# Patient Record
Sex: Male | Born: 1955
Health system: Southern US, Community
[De-identification: ages and names within clinical notes are randomized; demographics above are authoritative.]

## PROBLEM LIST (undated history)

## (undated) DIAGNOSIS — E785 Hyperlipidemia, unspecified: Secondary | ICD-10-CM

## (undated) DIAGNOSIS — I10 Essential (primary) hypertension: Secondary | ICD-10-CM

## (undated) DIAGNOSIS — K219 Gastro-esophageal reflux disease without esophagitis: Secondary | ICD-10-CM

## (undated) HISTORY — DX: Hyperlipidemia, unspecified: E78.5

## (undated) HISTORY — DX: Essential (primary) hypertension: I10

## (undated) HISTORY — DX: Gastro-esophageal reflux disease without esophagitis: K21.9

---

## 2011-10-07 ENCOUNTER — Other Ambulatory Visit (INDEPENDENT_AMBULATORY_CARE_PROVIDER_SITE_OTHER): Payer: Commercial Managed Care - PPO

## 2011-10-07 ENCOUNTER — Encounter: Payer: Self-pay | Admitting: Internal Medicine

## 2011-10-07 ENCOUNTER — Ambulatory Visit (INDEPENDENT_AMBULATORY_CARE_PROVIDER_SITE_OTHER): Payer: Commercial Managed Care - PPO | Admitting: Internal Medicine

## 2011-10-07 VITALS — BP 122/80 | HR 80 | Temp 97.4°F | Resp 16 | Ht 63.0 in | Wt 206.8 lb

## 2011-10-07 DIAGNOSIS — Z136 Encounter for screening for cardiovascular disorders: Secondary | ICD-10-CM

## 2011-10-07 DIAGNOSIS — Z23 Encounter for immunization: Secondary | ICD-10-CM

## 2011-10-07 DIAGNOSIS — Z0000 Encounter for general adult medical examination without abnormal findings: Secondary | ICD-10-CM

## 2011-10-07 DIAGNOSIS — I10 Essential (primary) hypertension: Secondary | ICD-10-CM | POA: Insufficient documentation

## 2011-10-07 DIAGNOSIS — E669 Obesity, unspecified: Secondary | ICD-10-CM | POA: Insufficient documentation

## 2011-10-07 LAB — LIPID PANEL
Cholesterol: 238 mg/dL — ABNORMAL HIGH (ref 0–200)
Triglycerides: 206 mg/dL — ABNORMAL HIGH (ref 0.0–149.0)

## 2011-10-07 LAB — CBC WITH DIFFERENTIAL/PLATELET
Basophils Absolute: 0.1 10*3/uL (ref 0.0–0.1)
HCT: 47.3 % (ref 39.0–52.0)
Hemoglobin: 15.7 g/dL (ref 13.0–17.0)
Lymphs Abs: 2 10*3/uL (ref 0.7–4.0)
MCV: 91.3 fl (ref 78.0–100.0)
Monocytes Absolute: 0.8 10*3/uL (ref 0.1–1.0)
Neutro Abs: 7.1 10*3/uL (ref 1.4–7.7)
Platelets: 239 10*3/uL (ref 150.0–400.0)
RDW: 13.2 % (ref 11.5–14.6)

## 2011-10-07 LAB — COMPREHENSIVE METABOLIC PANEL
ALT: 30 U/L (ref 0–53)
Alkaline Phosphatase: 83 U/L (ref 39–117)
CO2: 26 mEq/L (ref 19–32)
Creatinine, Ser: 0.9 mg/dL (ref 0.4–1.5)
GFR: 92.71 mL/min (ref >60.00)
Glucose, Bld: 87 mg/dL (ref 70–99)
Sodium: 139 mEq/L (ref 135–145)
Total Bilirubin: 0.5 mg/dL (ref 0.3–1.2)
Total Protein: 6.6 g/dL (ref 6.0–8.3)

## 2011-10-07 LAB — URINALYSIS, ROUTINE W REFLEX MICROSCOPIC
Bilirubin Urine: NEGATIVE
Leukocytes, UA: NEGATIVE
Nitrite: NEGATIVE
Specific Gravity, Urine: 1.02 (ref 1.000–1.030)
Total Protein, Urine: NEGATIVE
pH: 6 (ref 5.0–8.0)

## 2011-10-07 LAB — HEMOGLOBIN A1C: Hgb A1c MFr Bld: 5.7 % (ref 4.6–6.5)

## 2011-10-07 MED ORDER — LISINOPRIL-HYDROCHLOROTHIAZIDE 10-12.5 MG PO TABS: 1.0000 | ORAL_TABLET | Freq: Every day | ORAL | Status: DC

## 2011-10-07 NOTE — Progress Notes (Signed)
  Subjective:    Patient ID: Blake Anderson, male    DOB: 29-Nov-1955, 56 y.o.   MRN: [id number]  HPI New to me for a physical.   Review of Systems  Constitutional: Positive for unexpected weight change (weight gain). Negative for fever, chills, diaphoresis, activity change, appetite change and fatigue.  HENT: Negative.   Eyes: Negative.   Respiratory: Negative.  Negative for shortness of breath, wheezing and stridor.   Cardiovascular: Negative.  Negative for chest pain, palpitations and leg swelling.  Gastrointestinal: Negative.   Genitourinary: Negative.   Musculoskeletal: Negative.   Skin: Negative.   Neurological: Negative.   Hematological: Negative for adenopathy. Does not bruise/bleed easily.  Psychiatric/Behavioral: Negative.        Objective:   Physical Exam  Vitals reviewed. Constitutional: He is oriented to person, place, and time. He appears well-developed and well-nourished. No distress.  HENT:  Head: Normocephalic and atraumatic.  Mouth/Throat: Oropharynx is clear and moist. No oropharyngeal exudate.  Eyes: Conjunctivae are normal. Right eye exhibits no discharge. Left eye exhibits no discharge. No scleral icterus.  Neck: Normal range of motion. Neck supple. No JVD present. No tracheal deviation present. No thyromegaly present.  Cardiovascular: Normal rate, regular rhythm, normal heart sounds and intact distal pulses.  Exam reveals no gallop and no friction rub.   No murmur heard. Pulmonary/Chest: Effort normal and breath sounds normal. No stridor. No respiratory distress. He has no wheezes. He has no rales. He exhibits no tenderness.  Abdominal: Soft. Bowel sounds are normal. He exhibits no distension and no mass. There is no tenderness. There is no rebound and no guarding. Hernia confirmed negative in the right inguinal area and confirmed negative in the left inguinal area.  Genitourinary: Prostate normal, testes normal and penis normal. Rectal exam shows external  hemorrhoid. Rectal exam shows no internal hemorrhoid, no fissure, no mass, no tenderness and anal tone normal. Guaiac negative stool. Prostate is not enlarged and not tender. Right testis shows no mass, no swelling and no tenderness. Right testis is descended. Left testis shows no mass, no swelling and no tenderness. Left testis is descended. Circumcised. No penile tenderness. No discharge found.  Musculoskeletal: Normal range of motion. He exhibits no edema and no tenderness.  Lymphadenopathy:    He has no cervical adenopathy.       Right: No inguinal adenopathy present.       Left: No inguinal adenopathy present.  Neurological: He is oriented to person, place, and time.  Skin: Skin is warm and dry. No rash noted. He is not diaphoretic. No erythema. No pallor.  Psychiatric: He has a normal mood and affect. His behavior is normal. Judgment and thought content normal.          Assessment & Plan:

## 2011-10-07 NOTE — Patient Instructions (Addendum)
Health Maintenance, Males A healthy lifestyle and preventative care can promote health and wellness.  Maintain regular health, dental, and eye exams.   Eat a healthy diet. Foods like vegetables, fruits, whole grains, low-fat dairy products, and lean protein foods contain the nutrients you need without too many calories. Decrease your intake of foods high in solid fats, added sugars, and salt. Get information about a proper diet from your caregiver, if necessary.   Regular physical exercise is one of the most important things you can do for your health. Most adults should get at least 150 minutes of moderate-intensity exercise (any activity that increases your heart rate and causes you to sweat) each week. In addition, most adults need muscle-strengthening exercises on 2 or more days a week.    Maintain a healthy weight. The body mass index (BMI) is a screening tool to identify possible weight problems. It provides an estimate of body fat based on height and weight. Your caregiver can help determine your BMI, and can help you achieve or maintain a healthy weight. For adults 20 years and older:   A BMI below 18.5 is considered underweight.   A BMI of 18.5 to 24.9 is normal.   A BMI of 25 to 29.9 is considered overweight.   A BMI of 30 and above is considered obese.   Maintain normal blood lipids and cholesterol by exercising and minimizing your intake of saturated fat. Eat a balanced diet with plenty of fruits and vegetables. Blood tests for lipids and cholesterol should begin at age 20 and be repeated every 5 years. If your lipid or cholesterol levels are high, you are over 50, or you are a high risk for heart disease, you may need your cholesterol levels checked more frequently.Ongoing high lipid and cholesterol levels should be treated with medicines, if diet and exercise are not effective.   If you smoke, find out from your caregiver how to quit. If you do not use tobacco, do not start.    If you choose to drink alcohol, do not exceed 2 drinks per day. One drink is considered to be 12 ounces (355 mL) of beer, 5 ounces (148 mL) of wine, or 1.5 ounces (44 mL) of liquor.   Avoid use of street drugs. Do not share needles with anyone. Ask for help if you need support or instructions about stopping the use of drugs.   High blood pressure causes heart disease and increases the risk of stroke. Blood pressure should be checked at least every 1 to 2 years. Ongoing high blood pressure should be treated with medicines if weight loss and exercise are not effective.   If you are 45 to 57 years old, ask your caregiver if you should take aspirin to prevent heart disease.   Diabetes screening involves taking a blood sample to check your fasting blood sugar level. This should be done once every 3 years, after age 45, if you are within normal weight and without risk factors for diabetes. Testing should be considered at a younger age or be carried out more frequently if you are overweight and have at least 1 risk factor for diabetes.   Colorectal cancer can be detected and often prevented. Most routine colorectal cancer screening begins at the age of 50 and continues through age 75. However, your caregiver may recommend screening at an earlier age if you have risk factors for colon cancer. On a yearly basis, your caregiver may provide home test kits to check for hidden   blood in the stool. Use of a small camera at the end of a tube, to directly examine the colon (sigmoidoscopy or colonoscopy), can detect the earliest forms of colorectal cancer. Talk to your caregiver about this at age 50, when routine screening begins. Direct examination of the colon should be repeated every 5 to 10 years through age 75, unless early forms of pre-cancerous polyps or small growths are found.   Healthy men should no longer receive prostate-specific antigen (PSA) blood tests as part of routine cancer screening. Consult with  your caregiver about prostate cancer screening.   Practice safe sex. Use condoms and avoid high-risk sexual practices to reduce the spread of sexually transmitted infections (STIs).   Use sunscreen with a sun protection factor (SPF) of 30 or greater. Apply sunscreen liberally and repeatedly throughout the day. You should seek shade when your shadow is shorter than you. Protect yourself by wearing long sleeves, pants, a wide-brimmed hat, and sunglasses year round, whenever you are outdoors.   Notify your caregiver of new moles or changes in moles, especially if there is a change in shape or color. Also notify your caregiver if a mole is larger than the size of a pencil eraser.   A one-time screening for abdominal aortic aneurysm (AAA) and surgical repair of large AAAs by sound wave imaging (ultrasonography) is recommended for ages 65 to 75 years who are current or former smokers.   Stay current with your immunizations.  Document Released: 01/25/2008 Document Revised: 04/10/2011 Document Reviewed: 12/24/2010 [employer] Patient Information 2012 [employer].Hypertension As your heart beats, it forces blood through your arteries. This force is your blood pressure. If the pressure is too high, it is called hypertension (HTN) or high blood pressure. HTN is dangerous because you may have it and not know it. High blood pressure may mean that your heart has to work harder to pump blood. Your arteries may be narrow or stiff. The extra work puts you at risk for heart disease, stroke, and other problems.  Blood pressure consists of two numbers, a higher number over a lower, 110/72, for example. It is stated as "110 over 72." The ideal is below 120 for the top number (systolic) and under 80 for the bottom (diastolic). Write down your blood pressure today. You should pay close attention to your blood pressure if you have certain conditions such as:  Heart failure.   Prior heart attack.   Diabetes   Chronic  kidney disease.   Prior stroke.   Multiple risk factors for heart disease.  To see if you have HTN, your blood pressure should be measured while you are seated with your arm held at the level of the heart. It should be measured at least twice. A one-time elevated blood pressure reading (especially in the Emergency Department) does not mean that you need treatment. There may be conditions in which the blood pressure is different between your right and left arms. It is important to see your caregiver soon for a recheck. Most people have essential hypertension which means that there is not a specific cause. This type of high blood pressure may be lowered by changing lifestyle factors such as:  Stress.   Smoking.   Lack of exercise.   Excessive weight.   Drug/tobacco/alcohol use.   Eating less salt.  Most people do not have symptoms from high blood pressure until it has caused damage to the body. Effective treatment can often prevent, delay or reduce that damage. TREATMENT    When a cause has been identified, treatment for high blood pressure is directed at the cause. There are a large number of medications to treat HTN. These fall into several categories, and your caregiver will help you select the medicines that are best for you. Medications may have side effects. You should review side effects with your caregiver. If your blood pressure stays high after you have made lifestyle changes or started on medicines,   Your medication(s) may need to be changed.   Other problems may need to be addressed.   Be certain you understand your prescriptions, and know how and when to take your medicine.   Be sure to follow up with your caregiver within the time frame advised (usually within two weeks) to have your blood pressure rechecked and to review your medications.   If you are taking more than one medicine to lower your blood pressure, make sure you know how and at what times they should be taken.  Taking two medicines at the same time can result in blood pressure that is too low.  SEEK IMMEDIATE MEDICAL CARE IF:  You develop a severe headache, blurred or changing vision, or confusion.   You have unusual weakness or numbness, or a faint feeling.   You have severe chest or abdominal pain, vomiting, or breathing problems.  MAKE SURE YOU:   Understand these instructions.   Will watch your condition.   Will get help right away if you are not doing well or get worse.  Document Released: 07/29/2005 Document Revised: 04/10/2011 Document Reviewed: 03/18/2008 [employer] Patient Information 2012 [employer]. 

## 2011-10-07 NOTE — Assessment & Plan Note (Signed)
His BP is well controlled, I will check his lytes today 

## 2011-10-07 NOTE — Assessment & Plan Note (Signed)
His EKG is normal 

## 2011-10-07 NOTE — Assessment & Plan Note (Signed)
Exam done, labs ordered, vaccines were updated. Pt ed material was given.

## 2011-11-02 ENCOUNTER — Ambulatory Visit (INDEPENDENT_AMBULATORY_CARE_PROVIDER_SITE_OTHER): Payer: Commercial Managed Care - PPO | Admitting: Family Medicine

## 2011-11-02 ENCOUNTER — Encounter: Payer: Self-pay | Admitting: Family Medicine

## 2011-11-02 VITALS — BP 110/70 | Temp 98.0°F | Wt 201.0 lb

## 2011-11-02 DIAGNOSIS — J209 Acute bronchitis, unspecified: Secondary | ICD-10-CM

## 2011-11-02 MED ORDER — AZITHROMYCIN 250 MG PO TABS: ORAL_TABLET | ORAL | Status: DC

## 2011-11-02 NOTE — Progress Notes (Signed)
OFFICE NOTE  11/02/2011  CC:  Chief Complaint  Patient presents with  . Nasal Congestion     HPI: Patient is a 56 y.o. Caucasian male who is here for cold sx's. Pt presents complaining of respiratory symptoms for 5-6 days.  Mostly nasal congestion/runny nose, sneezing, and PND cough.  Worst symptoms seems to be the fatigue.  Lately the symptoms seem to be stable. No fevers, no wheezing, and no SOB.  No pain in face or teeth.  No significant HA.  ST mild at most.  Symptoms made worse by nothing.  Symptoms improved by OTC cough med helped some. Smoker? no Recent sick contact? yes Muscle or joint aches? Mild soreness Flu shot this season at least 2 wks ago? no  ROS: no n/v/d or abdominal pain.  No rash.  No neck stiffness.   +Mild fatigue.  +Mild appetite loss.   Pertinent PMH:  RAD HTN Obesity  MEDS:  Outpatient Prescriptions Prior to Visit  Medication Sig Dispense Refill  . lisinopril-hydrochlorothiazide (PRINZIDE,ZESTORETIC) 10-12.5 MG per tablet Take 1 tablet by mouth daily.  90 tablet  1    PE: Blood pressure 110/70, temperature 98 F (36.7 C), temperature source Oral, weight 201 lb (91.173 kg). VS: noted--normal. Gen: alert, NAD, NONTOXIC APPEARING. HEENT: eyes without injection, drainage, or swelling.  Ears: EACs clear, TMs with normal light reflex and landmarks.  Nose: Clear rhinorrhea, with some dried, crusty exudate adherent to mildly injected mucosa.  No purulent d/c.  No paranasal sinus TTP.  No facial swelling.  Throat and mouth without focal lesion.  No pharyngial swelling, erythema, or exudate.   Neck: supple, no LAD.   LUNGS: CTA bilat, nonlabored resps.   CV: RRR, no m/r/g. EXT: no c/c/e SKIN: no rash    IMPRESSION AND PLAN:  Prolonged URI/cough. Empiric azithromycinx 5d. OTC cough med (without decongestant).  FOLLOW UP: prn

## 2011-11-02 NOTE — Patient Instructions (Signed)
Avoid decongestants. Try mucinex DM or robitussin DM.

## 2012-03-19 ENCOUNTER — Other Ambulatory Visit (INDEPENDENT_AMBULATORY_CARE_PROVIDER_SITE_OTHER): Payer: Commercial Managed Care - PPO

## 2012-03-19 ENCOUNTER — Encounter: Payer: Self-pay | Admitting: Internal Medicine

## 2012-03-19 ENCOUNTER — Ambulatory Visit (INDEPENDENT_AMBULATORY_CARE_PROVIDER_SITE_OTHER): Payer: Commercial Managed Care - PPO | Admitting: Internal Medicine

## 2012-03-19 VITALS — BP 120/80 | HR 74 | Temp 98.2°F | Resp 15 | Wt 204.0 lb

## 2012-03-19 DIAGNOSIS — E782 Mixed hyperlipidemia: Secondary | ICD-10-CM

## 2012-03-19 DIAGNOSIS — I10 Essential (primary) hypertension: Secondary | ICD-10-CM

## 2012-03-19 LAB — COMPREHENSIVE METABOLIC PANEL
AST: 23 U/L (ref 0–37)
Alkaline Phosphatase: 100 U/L (ref 39–117)
BUN: 10 mg/dL (ref 6–23)
Creatinine, Ser: 1 mg/dL (ref 0.4–1.5)
Potassium: 4.4 mEq/L (ref 3.5–5.1)

## 2012-03-19 LAB — LIPID PANEL
HDL: 42.4 mg/dL (ref >39.00)
Total CHOL/HDL Ratio: 5
Triglycerides: 264 mg/dL — ABNORMAL HIGH (ref 0.0–149.0)
VLDL: 52.8 mg/dL — ABNORMAL HIGH (ref 0.0–40.0)

## 2012-03-19 MED ORDER — LISINOPRIL-HYDROCHLOROTHIAZIDE 10-12.5 MG PO TABS: 1.0000 | ORAL_TABLET | Freq: Every day | ORAL | Status: DC

## 2012-03-19 NOTE — Assessment & Plan Note (Signed)
His BP is well controlled, I will check his lytes and renal function today 

## 2012-03-19 NOTE — Assessment & Plan Note (Signed)
He does not want to treat this, I talked to him about his risk factors for CAD and CVA, he wants to work on his lifestyle mods, he is not willing to take a statin, I will check his FLP today and if his trigs are near 500 then he will need to start a med for that

## 2012-03-19 NOTE — Progress Notes (Signed)
  Subjective:    Patient ID: Blake Anderson, male    DOB: 02-18-1956, 56 y.o.   MRN: [id number]  Hypertension This is a chronic problem. The current episode started more than 1 year ago. The problem has been gradually improving since onset. The problem is controlled. Pertinent negatives include no anxiety, blurred vision, chest pain, headaches, malaise/fatigue, neck pain, orthopnea, palpitations, peripheral edema, PND, shortness of breath or sweats. Past treatments include ACE inhibitors and diuretics. The current treatment provides significant improvement. Compliance problems include exercise and diet.       Review of Systems  Constitutional: Negative.  Negative for malaise/fatigue.  HENT: Negative.  Negative for neck pain.   Eyes: Negative.  Negative for blurred vision.  Respiratory: Negative for cough, chest tightness, shortness of breath, wheezing and stridor.   Cardiovascular: Negative for chest pain, palpitations, orthopnea, leg swelling and PND.  Gastrointestinal: Negative for nausea, vomiting, abdominal pain, diarrhea, constipation and blood in stool.  Genitourinary: Negative.   Musculoskeletal: Negative.   Skin: Negative.   Neurological: Negative.  Negative for headaches.  Hematological: Negative.   Psychiatric/Behavioral: Negative.        Objective:   Physical Exam  Vitals reviewed. Constitutional: He is oriented to person, place, and time. He appears well-developed and well-nourished. No distress.  HENT:  Head: Normocephalic and atraumatic.  Mouth/Throat: Oropharynx is clear and moist. No oropharyngeal exudate.  Eyes: Conjunctivae are normal. Right eye exhibits no discharge. Left eye exhibits no discharge. No scleral icterus.  Neck: Normal range of motion. Neck supple. No JVD present. No tracheal deviation present. No thyromegaly present.  Cardiovascular: Normal rate, regular rhythm, normal heart sounds and intact distal pulses.  Exam reveals no gallop and no friction  rub.   No murmur heard. Pulmonary/Chest: Effort normal and breath sounds normal. No stridor. No respiratory distress. He has no wheezes. He has no rales. He exhibits no tenderness.  Abdominal: Soft. Bowel sounds are normal. He exhibits no distension and no mass. There is no tenderness. There is no rebound and no guarding.  Musculoskeletal: Normal range of motion. He exhibits no edema and no tenderness.  Lymphadenopathy:    He has no cervical adenopathy.  Neurological: He is oriented to person, place, and time.  Skin: Skin is warm and dry. No rash noted. He is not diaphoretic. No erythema. No pallor.  Psychiatric: He has a normal mood and affect. His behavior is normal. Judgment and thought content normal.      Lab Results  Component Value Date   WBC 10.3 10/07/2011   HGB 15.7 10/07/2011   HCT 47.3 10/07/2011   PLT 239.0 10/07/2011   GLUCOSE 87 10/07/2011   CHOL 238* 10/07/2011   TRIG 206.0* 10/07/2011   HDL 49.90 10/07/2011   LDLDIRECT 153.0 10/07/2011   ALT 30 10/07/2011   AST 27 10/07/2011   NA 139 10/07/2011   K 4.6 10/07/2011   CL 107 10/07/2011   CREATININE 0.9 10/07/2011   BUN 10 10/07/2011   CO2 26 10/07/2011   PSA 0.71 10/07/2011   HGBA1C 5.7 10/07/2011      Assessment & Plan:

## 2012-03-19 NOTE — Patient Instructions (Signed)
Hypertriglyceridemia  Diet for High blood levels of Triglycerides Most fats in food are triglycerides. Triglycerides in your blood are stored as fat in your body. High levels of triglycerides in your blood may put you at a greater risk for heart disease and stroke.  Normal triglyceride levels are less than 150 mg/dL. Borderline high levels are 150-199 mg/dl. High levels are 200 - 499 mg/dL, and very high triglyceride levels are greater than 500 mg/dL. The decision to treat high triglycerides is generally based on the level. For people with borderline or high triglyceride levels, treatment includes weight loss and exercise. Drugs are recommended for people with very high triglyceride levels. Many people who need treatment for high triglyceride levels have metabolic syndrome. This syndrome is a collection of disorders that often include: insulin resistance, high blood pressure, blood clotting problems, high cholesterol and triglycerides. TESTING PROCEDURE FOR TRIGLYCERIDES  You should not eat 4 hours before getting your triglycerides measured. The normal range of triglycerides is between 10 and 250 milligrams per deciliter (mg/dl). Some people may have extreme levels (1000 or above), but your triglyceride level may be too high if it is above 150 mg/dl, depending on what other risk factors you have for heart disease.   People with high blood triglycerides may also have high blood cholesterol levels. If you have high blood cholesterol as well as high blood triglycerides, your risk for heart disease is probably greater than if you only had high triglycerides. High blood cholesterol is one of the main risk factors for heart disease.  CHANGING YOUR DIET  Your weight can affect your blood triglyceride level. If you are more than 20% above your ideal body weight, you may be able to lower your blood triglycerides by losing weight. Eating less and exercising regularly is the best way to combat this. Fat provides  more calories than any other food. The best way to lose weight is to eat less fat. Only 30% of your total calories should come from fat. Less than 7% of your diet should come from saturated fat. A diet low in fat and saturated fat is the same as a diet to decrease blood cholesterol. By eating a diet lower in fat, you may lose weight, lower your blood cholesterol, and lower your blood triglyceride level.  Eating a diet low in fat, especially saturated fat, may also help you lower your blood triglyceride level. Ask your dietitian to help you figure how much fat you can eat based on the number of calories your caregiver has prescribed for you.  Exercise, in addition to helping with weight loss may also help lower triglyceride levels.   Alcohol can increase blood triglycerides. You may need to stop drinking alcoholic beverages.   Too much carbohydrate in your diet may also increase your blood triglycerides. Some complex carbohydrates are necessary in your diet. These may include bread, rice, potatoes, other starchy vegetables and cereals.   Reduce "simple" carbohydrates. These may include pure sugars, candy, honey, and jelly without losing other nutrients. If you have the kind of high blood triglycerides that is affected by the amount of carbohydrates in your diet, you will need to eat less sugar and less high-sugar foods. Your caregiver can help you with this.   Adding 2-4 grams of fish oil (EPA+ DHA) may also help lower triglycerides. Speak with your caregiver before adding any supplements to your regimen.  Following the Diet  Maintain your ideal weight. Your caregivers can help you with a diet. Generally,   eating less food and getting more exercise will help you lose weight. Joining a weight control group may also help. Ask your caregivers for a good weight control group in your area.  Eat low-fat foods instead of high-fat foods. This can help you lose weight too.  These foods are lower in fat. Eat MORE  of these:   Dried beans, peas, and lentils.   Egg whites.   Low-fat cottage cheese.   Fish.   Lean cuts of meat, such as round, sirloin, rump, and flank (cut extra fat off meat you fix).   Whole grain breads, cereals and pasta.   Skim and nonfat dry milk.   Low-fat yogurt.   Poultry without the skin.   Cheese made with skim or part-skim milk, such as mozzarella, parmesan, farmers', ricotta, or pot cheese.  These are higher fat foods. Eat LESS of these:   Whole milk and foods made from whole milk, such as American, blue, cheddar, monterey jack, and swiss cheese   High-fat meats, such as luncheon meats, sausages, knockwurst, bratwurst, hot dogs, ribs, corned beef, ground pork, and regular ground beef.   Fried foods.  Limit saturated fats in your diet. Substituting unsaturated fat for saturated fat may decrease your blood triglyceride level. You will need to read package labels to know which products contain saturated fats.  These foods are high in saturated fat. Eat LESS of these:   Fried pork skins.   Whole milk.   Skin and fat from poultry.   Palm oil.   Butter.   Shortening.   Cream cheese.   Bacon.   Margarines and baked goods made from listed oils.   Vegetable shortenings.   Chitterlings.   Fat from meats.   Coconut oil.   Palm kernel oil.   Lard.   Cream.   Sour cream.   Fatback.   Coffee whiteners and non-dairy creamers made with these oils.   Cheese made from whole milk.  Use unsaturated fats (both polyunsaturated and monounsaturated) moderately. Remember, even though unsaturated fats are better than saturated fats; you still want a diet low in total fat.  These foods are high in unsaturated fat:   Canola oil.   Sunflower oil.   Mayonnaise.   Almonds.   Peanuts.   Pine nuts.   Margarines made with these oils.   Safflower oil.   Olive oil.   Avocados.   Cashews.   Peanut butter.   Sunflower seeds.   Soybean oil.     Peanut oil.   Olives.   Pecans.   Walnuts.   Pumpkin seeds.  Avoid sugar and other high-sugar foods. This will decrease carbohydrates without decreasing other nutrients. Sugar in your food goes rapidly to your blood. When there is excess sugar in your blood, your liver may use it to make more triglycerides. Sugar also contains calories without other important nutrients.  Eat LESS of these:   Sugar, brown sugar, powdered sugar, jam, jelly, preserves, honey, syrup, molasses, pies, candy, cakes, cookies, frosting, pastries, colas, soft drinks, punches, fruit drinks, and regular gelatin.   Avoid alcohol. Alcohol, even more than sugar, may increase blood triglycerides. In addition, alcohol is high in calories and low in nutrients. Ask for sparkling water, or a diet soft drink instead of an alcoholic beverage.  Suggestions for planning and preparing meals   Bake, broil, grill or roast meats instead of frying.   Remove fat from meats and skin from poultry before cooking.   Add spices,   herbs, lemon juice or vinegar to vegetables instead of salt, rich sauces or gravies.   Use a non-stick skillet without fat or use no-stick sprays.   Cool and refrigerate stews and broth. Then remove the hardened fat floating on the surface before serving.   Refrigerate meat drippings and skim off fat to make low-fat gravies.   Serve more fish.   Use less butter, margarine and other high-fat spreads on bread or vegetables.   Use skim or reconstituted non-fat dry milk for cooking.   Cook with low-fat cheeses.   Substitute low-fat yogurt or cottage cheese for all or part of the sour cream in recipes for sauces, dips or congealed salads.   Use half yogurt/half mayonnaise in salad recipes.   Substitute evaporated skim milk for cream. Evaporated skim milk or reconstituted non-fat dry milk can be whipped and substituted for whipped cream in certain recipes.   Choose fresh fruits for dessert instead of  high-fat foods such as pies or cakes. Fruits are naturally low in fat.  When Dining Out   Order low-fat appetizers such as fruit or vegetable juice, pasta with vegetables or tomato sauce.   Select clear, rather than cream soups.   Ask that dressings and gravies be served on the side. Then use less of them.   Order foods that are baked, broiled, poached, steamed, stir-fried, or roasted.   Ask for margarine instead of butter, and use only a small amount.   Drink sparkling water, unsweetened tea or coffee, or diet soft drinks instead of alcohol or other sweet beverages.  QUESTIONS AND ANSWERS ABOUT OTHER FATS IN THE BLOOD: SATURATED FAT, TRANS FAT, AND CHOLESTEROL What is trans fat? Trans fat is a type of fat that is formed when vegetable oil is hardened through a process called hydrogenation. This process helps makes foods more solid, gives them shape, and prolongs their shelf life. Trans fats are also called hydrogenated or partially hydrogenated oils.  What do saturated fat, trans fat, and cholesterol in foods have to do with heart disease? Saturated fat, trans fat, and cholesterol in the diet all raise the level of LDL "bad" cholesterol in the blood. The higher the LDL cholesterol, the greater the risk for coronary heart disease (CHD). Saturated fat and trans fat raise LDL similarly.  What foods contain saturated fat, trans fat, and cholesterol? High amounts of saturated fat are found in animal products, such as fatty cuts of meat, chicken skin, and full-fat dairy products like butter, whole milk, cream, and cheese, and in tropical vegetable oils such as palm, palm kernel, and coconut oil. Trans fat is found in some of the same foods as saturated fat, such as vegetable shortening, some margarines (especially hard or stick margarine), crackers, cookies, baked goods, fried foods, salad dressings, and other processed foods made with partially hydrogenated vegetable oils. Small amounts of trans fat  also occur naturally in some animal products, such as milk products, beef, and lamb. Foods high in cholesterol include liver, other organ meats, egg yolks, shrimp, and full-fat dairy products. How can I use the new food label to make heart-healthy food choices? Check the Nutrition Facts panel of the food label. Choose foods lower in saturated fat, trans fat, and cholesterol. For saturated fat and cholesterol, you can also use the Percent Daily Value (%DV): 5% DV or less is low, and 20% DV or more is high. (There is no %DV for trans fat.) Use the Nutrition Facts panel to choose foods low in   saturated fat and cholesterol, and if the trans fat is not listed, read the ingredients and limit products that list shortening or hydrogenated or partially hydrogenated vegetable oil, which tend to be high in trans fat. POINTS TO REMEMBER: YOU NEED A LITTLE TLC (THERAPEUTIC LIFESTYLE CHANGES)  Discuss your risk for heart disease with your caregivers, and take steps to reduce risk factors.   Change your diet. Choose foods that are low in saturated fat, trans fat, and cholesterol.   Add exercise to your daily routine if it is not already being done. Participate in physical activity of moderate intensity, like brisk walking, for at least 30 minutes on most, and preferably all days of the week. No time? Break the 30 minutes into three, 10-minute segments during the day.   Stop smoking. If you do smoke, contact your caregiver to discuss ways in which they can help you quit.   Do not use street drugs.   Maintain a normal weight.   Maintain a healthy blood pressure.   Keep up with your blood work for checking the fats in your blood as directed by your caregiver.  Document Released: 05/16/2004 Document Revised: 07/18/2011 Document Reviewed: 12/12/2008 [employer] Patient Information 2012 [employer]. 

## 2012-04-03 ENCOUNTER — Other Ambulatory Visit: Payer: Self-pay | Admitting: Internal Medicine

## 2012-09-06 ENCOUNTER — Other Ambulatory Visit: Payer: Self-pay | Admitting: Internal Medicine

## 2012-09-09 ENCOUNTER — Other Ambulatory Visit (INDEPENDENT_AMBULATORY_CARE_PROVIDER_SITE_OTHER): Payer: Commercial Managed Care - PPO

## 2012-09-09 ENCOUNTER — Encounter: Payer: Self-pay | Admitting: Internal Medicine

## 2012-09-09 ENCOUNTER — Ambulatory Visit (INDEPENDENT_AMBULATORY_CARE_PROVIDER_SITE_OTHER): Payer: Commercial Managed Care - PPO | Admitting: Internal Medicine

## 2012-09-09 VITALS — BP 118/80 | HR 80 | Temp 97.5°F | Resp 16 | Wt 188.5 lb

## 2012-09-09 DIAGNOSIS — E782 Mixed hyperlipidemia: Secondary | ICD-10-CM

## 2012-09-09 DIAGNOSIS — I10 Essential (primary) hypertension: Secondary | ICD-10-CM

## 2012-09-09 LAB — BASIC METABOLIC PANEL
CO2: 25 mEq/L (ref 19–32)
Calcium: 9.5 mg/dL (ref 8.4–10.5)
Chloride: 102 mEq/L (ref 96–112)
Glucose, Bld: 95 mg/dL (ref 70–99)
Potassium: 4.3 mEq/L (ref 3.5–5.1)
Sodium: 135 mEq/L (ref 135–145)

## 2012-09-09 LAB — TSH: TSH: 1.81 u[IU]/mL (ref 0.35–5.50)

## 2012-09-09 MED ORDER — LISINOPRIL-HYDROCHLOROTHIAZIDE 10-12.5 MG PO TABS: ORAL_TABLET | ORAL | Status: DC

## 2012-09-09 NOTE — Progress Notes (Signed)
  Subjective:    Patient ID: Blake Anderson, male    DOB: 1956-04-02, 57 y.o.   MRN: [id number]  Hypertension This is a chronic problem. The current episode started more than 1 year ago. The problem has been gradually improving since onset. The problem is controlled. Pertinent negatives include no anxiety, blurred vision, chest pain, headaches, malaise/fatigue, neck pain, orthopnea, palpitations, peripheral edema, PND, shortness of breath or sweats. There are no associated agents to hypertension. Past treatments include diuretics and ACE inhibitors. The current treatment provides significant improvement. There are no compliance problems.       Review of Systems  Constitutional: Negative.  Negative for malaise/fatigue.  HENT: Negative.  Negative for neck pain.   Eyes: Negative.  Negative for blurred vision.  Respiratory: Negative for apnea, choking, chest tightness, shortness of breath, wheezing and stridor.   Cardiovascular: Negative for chest pain, palpitations, orthopnea, leg swelling and PND.  Gastrointestinal: Negative for nausea, vomiting, abdominal pain, diarrhea, constipation and blood in stool.  Genitourinary: Negative.   Musculoskeletal: Negative for myalgias, back pain, joint swelling, arthralgias and gait problem.  Skin: Negative for color change, pallor, rash and wound.  Neurological: Negative for dizziness, weakness, light-headedness, numbness and headaches.  Hematological: Negative for adenopathy.  Psychiatric/Behavioral: Negative.        Objective:   Physical Exam  Vitals reviewed. Constitutional: He is oriented to person, place, and time. He appears well-developed and well-nourished. No distress.  HENT:  Head: Normocephalic and atraumatic.  Mouth/Throat: Oropharynx is clear and moist. No oropharyngeal exudate.  Eyes: Conjunctivae normal are normal. Right eye exhibits no discharge. Left eye exhibits no discharge. No scleral icterus.  Neck: Normal range of motion.  Neck supple. No JVD present. No tracheal deviation present. No thyromegaly present.  Cardiovascular: Normal rate, regular rhythm, normal heart sounds and intact distal pulses.  Exam reveals no gallop and no friction rub.   No murmur heard. Pulmonary/Chest: Effort normal and breath sounds normal. No stridor. No respiratory distress. He has no wheezes. He has no rales. He exhibits no tenderness.  Abdominal: Soft. Bowel sounds are normal. He exhibits no distension and no mass. There is no tenderness. There is no rebound and no guarding.  Musculoskeletal: Normal range of motion. He exhibits no edema and no tenderness.  Lymphadenopathy:    He has no cervical adenopathy.  Neurological: He is oriented to person, place, and time.  Skin: Skin is warm and dry. No rash noted. He is not diaphoretic. No erythema. No pallor.  Psychiatric: He has a normal mood and affect. His behavior is normal. Judgment and thought content normal.     Lab Results  Component Value Date   WBC 10.3 10/07/2011   HGB 15.7 10/07/2011   HCT 47.3 10/07/2011   PLT 239.0 10/07/2011   GLUCOSE 80 03/19/2012   CHOL 227* 03/19/2012   TRIG 264.0* 03/19/2012   HDL 42.40 03/19/2012   LDLDIRECT 138.8 03/19/2012   ALT 28 03/19/2012   AST 23 03/19/2012   NA 134* 03/19/2012   K 4.4 03/19/2012   CL 100 03/19/2012   CREATININE 1.0 03/19/2012   BUN 10 03/19/2012   CO2 27 03/19/2012   PSA 0.71 10/07/2011   HGBA1C 5.7 10/07/2011       Assessment & Plan:

## 2012-09-09 NOTE — Assessment & Plan Note (Signed)
His BP is well controlled Today I will check his lytes and renal function 

## 2012-09-09 NOTE — Patient Instructions (Signed)

## 2012-09-24 ENCOUNTER — Ambulatory Visit: Payer: Commercial Managed Care - PPO | Admitting: Internal Medicine

## 2012-10-02 ENCOUNTER — Ambulatory Visit: Payer: Commercial Managed Care - PPO | Admitting: Internal Medicine

## 2013-01-08 ENCOUNTER — Ambulatory Visit (INDEPENDENT_AMBULATORY_CARE_PROVIDER_SITE_OTHER): Payer: Commercial Managed Care - PPO | Admitting: Internal Medicine

## 2013-01-08 ENCOUNTER — Encounter: Payer: Self-pay | Admitting: Internal Medicine

## 2013-01-08 ENCOUNTER — Ambulatory Visit (INDEPENDENT_AMBULATORY_CARE_PROVIDER_SITE_OTHER)
Admission: RE | Admit: 2013-01-08 | Discharge: 2013-01-08 | Disposition: A | Discharge status: Home / Self Care | Payer: Commercial Managed Care - PPO | Source: Ambulatory Visit | Attending: Internal Medicine | Admitting: Internal Medicine

## 2013-01-08 VITALS — BP 128/80 | HR 73 | Temp 98.1°F | Resp 16 | Wt 194.0 lb

## 2013-01-08 DIAGNOSIS — M25569 Pain in unspecified knee: Secondary | ICD-10-CM

## 2013-01-08 DIAGNOSIS — R9389 Abnormal findings on diagnostic imaging of other specified body structures: Secondary | ICD-10-CM

## 2013-01-08 DIAGNOSIS — R936 Abnormal findings on diagnostic imaging of limbs: Secondary | ICD-10-CM | POA: Insufficient documentation

## 2013-01-08 DIAGNOSIS — M25561 Pain in right knee: Secondary | ICD-10-CM

## 2013-01-08 DIAGNOSIS — I10 Essential (primary) hypertension: Secondary | ICD-10-CM

## 2013-01-08 MED ORDER — LISINOPRIL-HYDROCHLOROTHIAZIDE 10-12.5 MG PO TABS: ORAL_TABLET | ORAL | Status: DC

## 2013-01-08 NOTE — Progress Notes (Signed)
  Subjective:    Patient ID: Blake Anderson, male    DOB: February 26, 1956, 57 y.o.   MRN: [id number]  Arthritis Presents for initial visit. The disease course has been worsening. The condition has lasted for 3 months. He complains of pain. He reports no stiffness, joint swelling or joint warmth. Affected locations include the right knee. His pain is at a severity of 2/10. Pertinent negatives include no diarrhea, dry eyes, dry mouth, dysuria, fatigue, fever, pain at night, pain while resting, rash, Raynaud's syndrome, uveitis or weight loss. His past medical history is significant for osteoarthritis. Past treatments include acetaminophen and NSAIDs. The treatment provided moderate relief. Factors aggravating his arthritis include climbing stairs. Compliance with prior treatments has been good.      Review of Systems  Constitutional: Negative.  Negative for fever, chills, weight loss, diaphoresis, activity change, appetite change, fatigue and unexpected weight change.  HENT: Negative.   Eyes: Negative.   Respiratory: Negative.  Negative for cough, chest tightness, shortness of breath, wheezing and stridor.   Cardiovascular: Negative.  Negative for chest pain, palpitations and leg swelling.  Gastrointestinal: Negative.  Negative for diarrhea.  Endocrine: Negative.   Genitourinary: Negative.  Negative for dysuria.  Musculoskeletal: Positive for arthralgias and arthritis. Negative for myalgias, back pain, joint swelling, gait problem and stiffness.  Skin: Negative.  Negative for rash.  Allergic/Immunologic: Negative.   Neurological: Negative.  Negative for dizziness, weakness and light-headedness.  Hematological: Negative.  Negative for adenopathy. Does not bruise/bleed easily.  Psychiatric/Behavioral: Negative.        Objective:   Physical Exam  Vitals reviewed. Constitutional: He is oriented to person, place, and time. He appears well-developed and well-nourished. No distress.  HENT:  Head:  Normocephalic and atraumatic.  Mouth/Throat: Oropharynx is clear and moist. No oropharyngeal exudate.  Eyes: Conjunctivae are normal. Right eye exhibits no discharge. Left eye exhibits no discharge. No scleral icterus.  Neck: Normal range of motion. Neck supple. No JVD present. No tracheal deviation present. No thyromegaly present.  Cardiovascular: Normal rate, regular rhythm, normal heart sounds and intact distal pulses.  Exam reveals no gallop and no friction rub.   No murmur heard. Pulmonary/Chest: Breath sounds normal. No stridor. No respiratory distress. He has no wheezes. He has no rales. He exhibits no tenderness.  Abdominal: Soft. Bowel sounds are normal. He exhibits no distension and no mass. There is no tenderness. There is no rebound and no guarding.  Musculoskeletal: Normal range of motion. He exhibits no edema and no tenderness.       Right knee: He exhibits bony tenderness. He exhibits normal range of motion, no swelling, no effusion, no ecchymosis, no deformity, no laceration, no erythema, normal alignment, no LCL laxity, normal patellar mobility, normal meniscus and no MCL laxity. Tenderness found. No medial joint line, no lateral joint line, no MCL, no LCL and no patellar tendon tenderness noted.  Lymphadenopathy:    He has no cervical adenopathy.  Neurological: He is oriented to person, place, and time.  Skin: Skin is warm and dry. No rash noted. He is not diaphoretic. No erythema. No pallor.  Psychiatric: He has a normal mood and affect. His behavior is normal. Judgment and thought content normal.          Assessment & Plan:

## 2013-01-08 NOTE — Assessment & Plan Note (Signed)
His BP is well controlled 

## 2013-01-08 NOTE — Assessment & Plan Note (Signed)
He will continue advil and tylenol Since his plain film is abnormal, I have ordered an MRI

## 2013-01-08 NOTE — Patient Instructions (Signed)

## 2013-01-08 NOTE — Assessment & Plan Note (Signed)
Will get an MRI done to if this is a pathological lesion

## 2013-01-18 ENCOUNTER — Encounter: Payer: Self-pay | Admitting: Internal Medicine

## 2013-01-18 ENCOUNTER — Ambulatory Visit
Admission: RE | Admit: 2013-01-18 | Discharge: 2013-01-18 | Disposition: A | Discharge status: Home / Self Care | Payer: Commercial Managed Care - PPO | Source: Ambulatory Visit | Attending: Internal Medicine | Admitting: Internal Medicine

## 2013-01-18 DIAGNOSIS — M25561 Pain in right knee: Secondary | ICD-10-CM

## 2013-01-18 DIAGNOSIS — R936 Abnormal findings on diagnostic imaging of limbs: Secondary | ICD-10-CM

## 2013-05-07 ENCOUNTER — Other Ambulatory Visit: Payer: Self-pay | Admitting: Internal Medicine

## 2013-05-14 ENCOUNTER — Ambulatory Visit (INDEPENDENT_AMBULATORY_CARE_PROVIDER_SITE_OTHER): Payer: Commercial Managed Care - PPO | Admitting: Internal Medicine

## 2013-05-14 ENCOUNTER — Ambulatory Visit (INDEPENDENT_AMBULATORY_CARE_PROVIDER_SITE_OTHER): Payer: Commercial Managed Care - PPO

## 2013-05-14 ENCOUNTER — Encounter: Payer: Self-pay | Admitting: Internal Medicine

## 2013-05-14 VITALS — BP 110/78 | HR 69 | Temp 98.5°F | Resp 16 | Ht 63.0 in | Wt 202.1 lb

## 2013-05-14 DIAGNOSIS — Z0000 Encounter for general adult medical examination without abnormal findings: Secondary | ICD-10-CM

## 2013-05-14 DIAGNOSIS — Z23 Encounter for immunization: Secondary | ICD-10-CM

## 2013-05-14 LAB — CBC WITH DIFFERENTIAL/PLATELET
Basophils Absolute: 0 10*3/uL (ref 0.0–0.1)
Basophils Relative: 0.5 % (ref 0.0–3.0)
Eosinophils Absolute: 0.2 10*3/uL (ref 0.0–0.7)
Eosinophils Relative: 2.7 % (ref 0.0–5.0)
HCT: 45.9 % (ref 39.0–52.0)
Hemoglobin: 15.6 g/dL (ref 13.0–17.0)
Lymphocytes Relative: 25.1 % (ref 12.0–46.0)
Lymphs Abs: 2 10*3/uL (ref 0.7–4.0)
MCHC: 34 g/dL (ref 30.0–36.0)
MCV: 89.1 fl (ref 78.0–100.0)
Monocytes Absolute: 0.8 10*3/uL (ref 0.1–1.0)
Monocytes Relative: 9.9 % (ref 3.0–12.0)
Neutro Abs: 4.8 10*3/uL (ref 1.4–7.7)
Neutrophils Relative %: 61.8 % (ref 43.0–77.0)
Platelets: 266 10*3/uL (ref 150.0–400.0)
RBC: 5.16 Mil/uL (ref 4.22–5.81)
RDW: 13.5 % (ref 11.5–14.6)
WBC: 7.8 10*3/uL (ref 4.5–10.5)

## 2013-05-14 LAB — LIPID PANEL
Cholesterol: 261 mg/dL — ABNORMAL HIGH (ref 0–200)
HDL: 45.2 mg/dL (ref >39.00)
Total CHOL/HDL Ratio: 6
VLDL: 40.6 mg/dL — ABNORMAL HIGH (ref 0.0–40.0)

## 2013-05-14 LAB — COMPREHENSIVE METABOLIC PANEL
ALT: 49 U/L (ref 0–53)
Alkaline Phosphatase: 81 U/L (ref 39–117)
BUN: 12 mg/dL (ref 6–23)
CO2: 24 mEq/L (ref 19–32)
Creatinine, Ser: 1 mg/dL (ref 0.4–1.5)
GFR: 86.6 mL/min (ref >60.00)
Potassium: 4 mEq/L (ref 3.5–5.1)
Sodium: 138 mEq/L (ref 135–145)
Total Bilirubin: 0.9 mg/dL (ref 0.3–1.2)
Total Protein: 6.9 g/dL (ref 6.0–8.3)

## 2013-05-14 LAB — PSA: PSA: 0.66 ng/mL (ref 0.10–4.00)

## 2013-05-14 LAB — TSH: TSH: 1.63 u[IU]/mL (ref 0.35–5.50)

## 2013-05-14 NOTE — Patient Instructions (Signed)
Health Maintenance, Males A healthy lifestyle and preventative care can promote health and wellness.  Maintain regular health, dental, and eye exams.  Eat a healthy diet. Foods like vegetables, fruits, whole grains, low-fat dairy products, and lean protein foods contain the nutrients you need without too many calories. Decrease your intake of foods high in solid fats, added sugars, and salt. Get information about a proper diet from your caregiver, if necessary.  Regular physical exercise is one of the most important things you can do for your health. Most adults should get at least 150 minutes of moderate-intensity exercise (any activity that increases your heart rate and causes you to sweat) each week. In addition, most adults need muscle-strengthening exercises on 2 or more days a week.   Maintain a healthy weight. The body mass index (BMI) is a screening tool to identify possible weight problems. It provides an estimate of body fat based on height and weight. Your caregiver can help determine your BMI, and can help you achieve or maintain a healthy weight. For adults 20 years and older:  A BMI below 18.5 is considered underweight.  A BMI of 18.5 to 24.9 is normal.  A BMI of 25 to 29.9 is considered overweight.  A BMI of 30 and above is considered obese.  Maintain normal blood lipids and cholesterol by exercising and minimizing your intake of saturated fat. Eat a balanced diet with plenty of fruits and vegetables. Blood tests for lipids and cholesterol should begin at age 20 and be repeated every 5 years. If your lipid or cholesterol levels are high, you are over 50, or you are a high risk for heart disease, you may need your cholesterol levels checked more frequently.Ongoing high lipid and cholesterol levels should be treated with medicines, if diet and exercise are not effective.  If you smoke, find out from your caregiver how to quit. If you do not use tobacco, do not start.  If you  choose to drink alcohol, do not exceed 2 drinks per day. One drink is considered to be 12 ounces (355 mL) of beer, 5 ounces (148 mL) of wine, or 1.5 ounces (44 mL) of liquor.  Avoid use of street drugs. Do not share needles with anyone. Ask for help if you need support or instructions about stopping the use of drugs.  High blood pressure causes heart disease and increases the risk of stroke. Blood pressure should be checked at least every 1 to 2 years. Ongoing high blood pressure should be treated with medicines if weight loss and exercise are not effective.  If you are 45 to 58 years old, ask your caregiver if you should take aspirin to prevent heart disease.  Diabetes screening involves taking a blood sample to check your fasting blood sugar level. This should be done once every 3 years, after age 45, if you are within normal weight and without risk factors for diabetes. Testing should be considered at a younger age or be carried out more frequently if you are overweight and have at least 1 risk factor for diabetes.  Colorectal cancer can be detected and often prevented. Most routine colorectal cancer screening begins at the age of 50 and continues through age 75. However, your caregiver may recommend screening at an earlier age if you have risk factors for colon cancer. On a yearly basis, your caregiver may provide home test kits to check for hidden blood in the stool. Use of a small camera at the end of a tube,   to directly examine the colon (sigmoidoscopy or colonoscopy), can detect the earliest forms of colorectal cancer. Talk to your caregiver about this at age 50, when routine screening begins. Direct examination of the colon should be repeated every 5 to 10 years through age 75, unless early forms of pre-cancerous polyps or small growths are found.  Hepatitis C blood testing is recommended for all people born from 1945 through 1965 and any individual with known risks for hepatitis C.  Healthy  men should no longer receive prostate-specific antigen (PSA) blood tests as part of routine cancer screening. Consult with your caregiver about prostate cancer screening.  Testicular cancer screening is not recommended for adolescents or adult males who have no symptoms. Screening includes self-exam, caregiver exam, and other screening tests. Consult with your caregiver about any symptoms you have or any concerns you have about testicular cancer.  Practice safe sex. Use condoms and avoid high-risk sexual practices to reduce the spread of sexually transmitted infections (STIs).  Use sunscreen with a sun protection factor (SPF) of 30 or greater. Apply sunscreen liberally and repeatedly throughout the day. You should seek shade when your shadow is shorter than you. Protect yourself by wearing long sleeves, pants, a wide-brimmed hat, and sunglasses year round, whenever you are outdoors.  Notify your caregiver of new moles or changes in moles, especially if there is a change in shape or color. Also notify your caregiver if a mole is larger than the size of a pencil eraser.  A one-time screening for abdominal aortic aneurysm (AAA) and surgical repair of large AAAs by sound wave imaging (ultrasonography) is recommended for ages 65 to 75 years who are current or former smokers.  Stay current with your immunizations. Document Released: 01/25/2008 Document Revised: 10/21/2011 Document Reviewed: 12/24/2010 [employer] Patient Information 2014 [employer].  

## 2013-05-14 NOTE — Progress Notes (Signed)
Subjective:    Patient ID: Blake Anderson, male    DOB: 04/05/1956, 57 y.o.   MRN: [id number]  Hypertension This is a chronic problem. The current episode started more than 1 year ago. The problem has been gradually improving since onset. The problem is controlled. Pertinent negatives include no anxiety, blurred vision, chest pain, headaches, malaise/fatigue, neck pain, orthopnea, palpitations, peripheral edema, PND, shortness of breath or sweats. Past treatments include ACE inhibitors and diuretics. The current treatment provides moderate improvement. Compliance problems include exercise and diet.       Review of Systems  Constitutional: Negative.  Negative for malaise/fatigue.  HENT: Negative.  Negative for neck pain.   Eyes: Negative.  Negative for blurred vision.  Respiratory: Negative.  Negative for cough, choking, chest tightness, shortness of breath, wheezing and stridor.   Cardiovascular: Negative.  Negative for chest pain, palpitations, orthopnea, leg swelling and PND.  Gastrointestinal: Negative.  Negative for nausea, vomiting, abdominal pain, diarrhea, constipation and blood in stool.  Endocrine: Negative.   Genitourinary: Negative.   Musculoskeletal: Negative.   Skin: Negative.   Allergic/Immunologic: Negative.   Neurological: Negative.  Negative for dizziness and headaches.  Hematological: Negative.  Negative for adenopathy. Does not bruise/bleed easily.  Psychiatric/Behavioral: Negative.        Objective:   Physical Exam  Vitals reviewed. Constitutional: He is oriented to person, place, and time. He appears well-developed and well-nourished. No distress.  HENT:  Head: Normocephalic and atraumatic.  Mouth/Throat: Oropharynx is clear and moist. No oropharyngeal exudate.  Eyes: Conjunctivae are normal. Right eye exhibits no discharge. Left eye exhibits no discharge. No scleral icterus.  Neck: Normal range of motion. Neck supple. No JVD present. No tracheal deviation  present. No thyromegaly present.  Cardiovascular: Normal rate, regular rhythm, normal heart sounds and intact distal pulses.  Exam reveals no gallop and no friction rub.   No murmur heard. Pulmonary/Chest: Effort normal and breath sounds normal. No stridor. No respiratory distress. He has no wheezes. He has no rales. He exhibits no tenderness.  Abdominal: Soft. Bowel sounds are normal. He exhibits no distension and no mass. There is no tenderness. There is no rebound and no guarding. Hernia confirmed negative in the right inguinal area and confirmed negative in the left inguinal area.  Genitourinary: Prostate normal, testes normal and penis normal. Rectal exam shows external hemorrhoid. Rectal exam shows no internal hemorrhoid, no fissure, no mass, no tenderness and anal tone normal. Guaiac negative stool. Prostate is not enlarged and not tender. Right testis shows no mass, no swelling and no tenderness. Right testis is descended. Left testis shows no mass, no swelling and no tenderness. Left testis is descended. Circumcised. No penile erythema or penile tenderness. No discharge found.  Musculoskeletal: Normal range of motion. He exhibits no edema and no tenderness.  Lymphadenopathy:    He has no cervical adenopathy.       Right: No inguinal adenopathy present.       Left: No inguinal adenopathy present.  Neurological: He is oriented to person, place, and time.  Skin: Skin is warm and dry. No rash noted. He is not diaphoretic. No erythema. No pallor.  Psychiatric: He has a normal mood and affect. His behavior is normal. Judgment and thought content normal.     Lab Results  Component Value Date   WBC 10.3 10/07/2011   HGB 15.7 10/07/2011   HCT 47.3 10/07/2011   PLT 239.0 10/07/2011   GLUCOSE 95 09/09/2012   CHOL 227* 03/19/2012  TRIG 264.0* 03/19/2012   HDL 42.40 03/19/2012   LDLDIRECT 138.8 03/19/2012   ALT 28 03/19/2012   AST 23 03/19/2012   NA 135 09/09/2012   K 4.3 09/09/2012   CL 102 09/09/2012    CREATININE 1.0 09/09/2012   BUN 11 09/09/2012   CO2 25 09/09/2012   TSH 1.81 09/09/2012   PSA 0.71 10/07/2011   HGBA1C 5.7 10/07/2011       Assessment & Plan:

## 2013-05-16 NOTE — Assessment & Plan Note (Signed)
Exam done Labs ordered Vaccines were reviewed and updated Pt ed material was given

## 2013-05-17 ENCOUNTER — Encounter: Payer: Self-pay | Admitting: Internal Medicine

## 2013-08-11 ENCOUNTER — Emergency Department (HOSPITAL_COMMUNITY): Payer: Commercial Managed Care - PPO

## 2013-08-11 ENCOUNTER — Encounter (HOSPITAL_COMMUNITY): Payer: Self-pay | Admitting: Emergency Medicine

## 2013-08-11 ENCOUNTER — Emergency Department (HOSPITAL_COMMUNITY)
Admission: EM | Admit: 2013-08-11 | Discharge: 2013-08-11 | Disposition: A | Discharge status: Home / Self Care | Payer: Commercial Managed Care - PPO | Attending: Emergency Medicine | Admitting: Emergency Medicine

## 2013-08-11 DIAGNOSIS — R630 Anorexia: Secondary | ICD-10-CM | POA: Insufficient documentation

## 2013-08-11 DIAGNOSIS — Z7982 Long term (current) use of aspirin: Secondary | ICD-10-CM | POA: Insufficient documentation

## 2013-08-11 DIAGNOSIS — J40 Bronchitis, not specified as acute or chronic: Secondary | ICD-10-CM | POA: Insufficient documentation

## 2013-08-11 DIAGNOSIS — I10 Essential (primary) hypertension: Secondary | ICD-10-CM | POA: Insufficient documentation

## 2013-08-11 DIAGNOSIS — J3489 Other specified disorders of nose and nasal sinuses: Secondary | ICD-10-CM | POA: Insufficient documentation

## 2013-08-11 DIAGNOSIS — Z79899 Other long term (current) drug therapy: Secondary | ICD-10-CM | POA: Insufficient documentation

## 2013-08-11 DIAGNOSIS — IMO0001 Reserved for inherently not codable concepts without codable children: Secondary | ICD-10-CM | POA: Insufficient documentation

## 2013-08-11 DIAGNOSIS — Z8639 Personal history of other endocrine, nutritional and metabolic disease: Secondary | ICD-10-CM | POA: Insufficient documentation

## 2013-08-11 DIAGNOSIS — J029 Acute pharyngitis, unspecified: Secondary | ICD-10-CM | POA: Insufficient documentation

## 2013-08-11 DIAGNOSIS — Z8701 Personal history of pneumonia (recurrent): Secondary | ICD-10-CM | POA: Insufficient documentation

## 2013-08-11 DIAGNOSIS — Z8719 Personal history of other diseases of the digestive system: Secondary | ICD-10-CM | POA: Insufficient documentation

## 2013-08-11 DIAGNOSIS — R5381 Other malaise: Secondary | ICD-10-CM | POA: Insufficient documentation

## 2013-08-11 DIAGNOSIS — Z862 Personal history of diseases of the blood and blood-forming organs and certain disorders involving the immune mechanism: Secondary | ICD-10-CM | POA: Insufficient documentation

## 2013-08-11 DIAGNOSIS — H9209 Otalgia, unspecified ear: Secondary | ICD-10-CM | POA: Insufficient documentation

## 2013-08-11 DIAGNOSIS — R000 Tachycardia, unspecified: Secondary | ICD-10-CM | POA: Insufficient documentation

## 2013-08-11 LAB — CBC WITH DIFFERENTIAL/PLATELET
Eosinophils Absolute: 0.5 10*3/uL (ref 0.0–0.7)
Eosinophils Relative: 4 % (ref 0–5)
HCT: 45 % (ref 39.0–52.0)
Hemoglobin: 15.9 g/dL (ref 13.0–17.0)
Lymphocytes Relative: 18 % (ref 12–46)
Lymphs Abs: 2.3 10*3/uL (ref 0.7–4.0)
MCH: 31.7 pg (ref 26.0–34.0)
MCV: 89.6 fL (ref 78.0–100.0)
Monocytes Absolute: 1.1 10*3/uL — ABNORMAL HIGH (ref 0.1–1.0)
Monocytes Relative: 9 % (ref 3–12)
RBC: 5.02 MIL/uL (ref 4.22–5.81)
WBC: 13 10*3/uL — ABNORMAL HIGH (ref 4.0–10.5)

## 2013-08-11 LAB — POCT I-STAT, CHEM 8
BUN: 8 mg/dL (ref 6–23)
Calcium, Ion: 1.17 mmol/L (ref 1.12–1.23)
Chloride: 101 mEq/L (ref 96–112)
Glucose, Bld: 97 mg/dL (ref 70–99)
HCT: 49 % (ref 39.0–52.0)
Potassium: 3.9 mEq/L (ref 3.7–5.3)
Sodium: 138 mEq/L (ref 137–147)

## 2013-08-11 MED ORDER — METOCLOPRAMIDE HCL 5 MG/ML IJ SOLN
10.0000 mg | Freq: Once | INTRAMUSCULAR | Status: AC
  Administered 2013-08-11: 10 mg via INTRAVENOUS
  Filled 2013-08-11: qty 2

## 2013-08-11 MED ORDER — DEXAMETHASONE SODIUM PHOSPHATE 10 MG/ML IJ SOLN
10.0000 mg | Freq: Once | INTRAMUSCULAR | Status: AC
  Administered 2013-08-11: 10 mg via INTRAVENOUS
  Filled 2013-08-11: qty 1

## 2013-08-11 MED ORDER — DIPHENHYDRAMINE HCL 50 MG/ML IJ SOLN
25.0000 mg | Freq: Once | INTRAMUSCULAR | Status: AC
  Administered 2013-08-11: 25 mg via INTRAVENOUS
  Filled 2013-08-11: qty 1

## 2013-08-11 MED ORDER — SODIUM CHLORIDE 0.9 % IV BOLUS (SEPSIS)
1000.0000 mL | Freq: Once | INTRAVENOUS | Status: AC
  Administered 2013-08-11: 1000 mL via INTRAVENOUS

## 2013-08-11 MED ORDER — ALBUTEROL SULFATE HFA 108 (90 BASE) MCG/ACT IN AERS: 2.0000 | INHALATION_SPRAY | RESPIRATORY_TRACT | Status: DC | PRN

## 2013-08-11 NOTE — ED Provider Notes (Signed)
CSN: [id number]     Arrival date & time 08/11/13  1504 History   First MD Initiated Contact with Patient 08/11/13 1629     Chief Complaint  Patient presents with  . Cough  . Fatigue   (Consider location/radiation/quality/duration/timing/severity/associated sxs/prior Treatment) HPI Comments: Patient is 57 year old male with PMHx of HTN and lyperlipidemia who presents to the ED with complaints of headache, left ear "soreness", sore throat, cough and congestion.  He denies fever, but reports chills, states no chest pain but mild shortness of breath with exertion.  States this feels like the last time he had pneumonia.  Reports non-purulent sputum production (white and clear), reports mild chest pain with cough but none other.  Denies lower extremity pain or edema, has recently returned from [place] where he drove but states that he stopped frequently during the trip.  Reports decrease in appetite.  Patient is a 57 y.o. male presenting with cough. The history is provided by the patient. No language interpreter was used.  Cough Cough characteristics:  Productive Sputum characteristics:  Clear and white Severity:  Moderate Onset quality:  Gradual Duration:  3 days Timing:  Constant Progression:  Worsening Chronicity:  New Smoker: no   Context: upper respiratory infection   Context: not exposure to allergens, not occupational exposure, not sick contacts, not weather changes and not with activity   Relieved by:  Nothing Worsened by:  Nothing tried Ineffective treatments:  None tried Associated symptoms: chills, ear pain, headaches, myalgias, rhinorrhea and sore throat   Associated symptoms: no chest pain, no eye discharge, no fever, no rash, no shortness of breath, no sinus congestion, no weight loss and no wheezing     Past Medical History  Diagnosis Date  . Hypertension   . Hyperlipidemia   . GERD (gastroesophageal reflux disease)    History reviewed. No pertinent past surgical  history. Family History  Problem Relation Age of Onset  . Arthritis Mother   . Alcohol abuse Father   . Asthma Neg Hx   . Cancer Neg Hx   . COPD Neg Hx   . Diabetes Neg Hx   . Heart disease Neg Hx   . Hyperlipidemia Neg Hx   . Hypertension Neg Hx   . Kidney disease Neg Hx   . Stroke Neg Hx    History  Substance Use Topics  . Smoking status: Never Smoker   . Smokeless tobacco: Never Used  . Alcohol Use: No    Review of Systems  Constitutional: Positive for chills. Negative for fever and weight loss.  HENT: Positive for ear pain, rhinorrhea and sore throat.   Eyes: Negative for discharge.  Respiratory: Positive for cough. Negative for shortness of breath and wheezing.   Cardiovascular: Negative for chest pain.  Musculoskeletal: Positive for myalgias.  Skin: Negative for rash.  Neurological: Positive for headaches.  All other systems reviewed and are negative.    Allergies  Review of patient's allergies indicates no known allergies.  Home Medications   Current Outpatient Rx  Name  Route  Sig  Dispense  Refill  . aspirin 81 MG tablet   Oral   Take 81 mg by mouth daily.         Marland Kitchen lisinopril-hydrochlorothiazide (PRINZIDE,ZESTORETIC) 10-12.5 MG per tablet      TAKE 1 TABLET BY MOUTH DAILY   90 tablet   2   . Multiple Vitamins-Minerals (MULTIVITAMIN WITH MINERALS) tablet   Oral   Take 1 tablet by mouth daily.         Marland Kitchen  Omega-3 Fatty Acids (FISH OIL) 1000 MG CPDR   Oral   Take 1,000 mg by mouth daily.          BP 149/62  Pulse 107  Temp(Src) 98.1 F (36.7 C) (Oral)  Resp 19  Wt 205 lb (92.987 kg)  SpO2 95% Physical Exam  Nursing note and vitals reviewed. Constitutional: He is oriented to person, place, and time. He appears well-developed and well-nourished. No distress.  HENT:  Head: Normocephalic and atraumatic.  Right Ear: External ear normal.  Left Ear: External ear normal.  Mouth/Throat: Oropharynx is clear and moist. No oropharyngeal  exudate.  Boggy nasal mucosa with clear rhinorrhea - no tenderness to palpation of left ear, mastoid process  Eyes: Conjunctivae are normal. Pupils are equal, round, and reactive to light. No scleral icterus.  Neck: Normal range of motion. Neck supple.  Cardiovascular: Regular rhythm and normal heart sounds.  Exam reveals no gallop and no friction rub.   No murmur heard. tachycardia  Pulmonary/Chest: Effort normal and breath sounds normal. No respiratory distress. He has no wheezes. He has no rales. He exhibits no tenderness.  Abdominal: Soft. Bowel sounds are normal. He exhibits no distension. There is no tenderness. There is no rebound and no guarding.  Musculoskeletal: Normal range of motion. He exhibits no edema and no tenderness.  Lymphadenopathy:    He has no cervical adenopathy.  Neurological: He is alert and oriented to person, place, and time. He exhibits normal muscle tone. Coordination normal.  Skin: Skin is warm and dry. No rash noted. No erythema. No pallor.  Psychiatric: He has a normal mood and affect. His behavior is normal. Judgment and thought content normal.    ED Course  Procedures (including critical care time) Labs Review Labs Reviewed  RAPID STREP SCREEN  CBC WITH DIFFERENTIAL   Imaging Review Dg Chest 2 View  08/11/2013   CLINICAL DATA:  Cough, congestion  EXAM: CHEST  2 VIEW  COMPARISON:  None.  FINDINGS: Cardiomediastinal silhouette is unremarkable. No acute infiltrate or pulmonary edema. Accessory azygos fissure/ lobe is noted. Mild degenerative changes thoracic spine.  IMPRESSION: No active cardiopulmonary disease.   Electronically Signed   By: Natasha Mead M.D.   On: 08/11/2013 17:03    EKG Interpretation   None      Results for orders placed during the hospital encounter of 08/11/13  RAPID STREP SCREEN      Result Value Range   Streptococcus, Group A Screen (Direct) NEGATIVE  NEGATIVE  CBC WITH DIFFERENTIAL      Result Value Range   WBC 13.0 (*)  4.0 - 10.5 K/uL   RBC 5.02  4.22 - 5.81 MIL/uL   Hemoglobin 15.9  13.0 - 17.0 g/dL   HCT 40.9  81.1 - 91.4 %   MCV 89.6  78.0 - 100.0 fL   MCH 31.7  26.0 - 34.0 pg   MCHC 35.3  30.0 - 36.0 g/dL   RDW 78.2  95.6 - 21.3 %   Platelets 252  150 - 400 K/uL   Neutrophils Relative % 69  43 - 77 %   Neutro Abs 9.0 (*) 1.7 - 7.7 K/uL   Lymphocytes Relative 18  12 - 46 %   Lymphs Abs 2.3  0.7 - 4.0 K/uL   Monocytes Relative 9  3 - 12 %   Monocytes Absolute 1.1 (*) 0.1 - 1.0 K/uL   Eosinophils Relative 4  0 - 5 %   Eosinophils Absolute 0.5  0.0 - 0.7 K/uL   Basophils Relative 1  0 - 1 %   Basophils Absolute 0.1  0.0 - 0.1 K/uL  POCT I-STAT, CHEM 8      Result Value Range   Sodium 138  137 - 147 mEq/L   Potassium 3.9  3.7 - 5.3 mEq/L   Chloride 101  96 - 112 mEq/L   BUN 8  6 - 23 mg/dL   Creatinine, Ser 1.61  0.50 - 1.35 mg/dL   Glucose, Bld 97  70 - 99 mg/dL   Calcium, Ion 0.96  0.45 - 1.23 mmol/L   TCO2 23  0 - 100 mmol/L   Hemoglobin 16.7  13.0 - 17.0 g/dL   HCT 40.9  81.1 - 91.4 %   Dg Chest 2 View  08/11/2013   CLINICAL DATA:  Cough, congestion  EXAM: CHEST  2 VIEW  COMPARISON:  None.  FINDINGS: Cardiomediastinal silhouette is unremarkable. No acute infiltrate or pulmonary edema. Accessory azygos fissure/ lobe is noted. Mild degenerative changes thoracic spine.  IMPRESSION: No active cardiopulmonary disease.   Electronically Signed   By: Natasha Mead M.D.   On: 08/11/2013 17:03      MDM  Bronchitis  Patient here with cough, shortness of breath, fatigue and body aches - though he does not endorse fever, his symptoms are more influenza like illness type but could also be c/w bronchitis - will place on inhaler and rest.   Izola Price. Marisue Humble, [place] 08/11/13 1913

## 2013-08-11 NOTE — ED Notes (Signed)
Pt reports having productive cough, fatigue and body aches for several days. Denies n/v/d. No acute distress noted at triage.

## 2013-08-11 NOTE — ED Provider Notes (Signed)
Complains of cough productive of whitish mild shortness of breath headache and sore throat for approximately 3 days. Patient is in no respiratory distress oral pharynx minimally reddened lungs speaks in paragraphs no retractions scant minimal diffuse rhonchi  Doug Sou, MD 08/11/13 1858

## 2013-08-11 NOTE — ED Notes (Signed)
Patient transported to X-ray 

## 2013-08-11 NOTE — ED Provider Notes (Signed)
Medical screening examination/treatment/procedure(s) were conducted as a shared visit with non-physician practitioner(s) and myself.  I personally evaluated the patient during the encounter.  EKG Interpretation   None        Doug Sou, MD 08/11/13 2050

## 2013-08-13 LAB — CULTURE, GROUP A STREP

## 2013-11-12 ENCOUNTER — Ambulatory Visit: Payer: Commercial Managed Care - PPO | Admitting: Internal Medicine

## 2013-11-19 ENCOUNTER — Ambulatory Visit (INDEPENDENT_AMBULATORY_CARE_PROVIDER_SITE_OTHER): Payer: Commercial Managed Care - PPO | Admitting: Internal Medicine

## 2013-11-19 ENCOUNTER — Other Ambulatory Visit (INDEPENDENT_AMBULATORY_CARE_PROVIDER_SITE_OTHER): Payer: Commercial Managed Care - PPO

## 2013-11-19 ENCOUNTER — Encounter: Payer: Self-pay | Admitting: Internal Medicine

## 2013-11-19 VITALS — BP 118/70 | HR 70 | Temp 99.1°F | Resp 16 | Wt 206.0 lb

## 2013-11-19 DIAGNOSIS — E785 Hyperlipidemia, unspecified: Secondary | ICD-10-CM

## 2013-11-19 DIAGNOSIS — D72829 Elevated white blood cell count, unspecified: Secondary | ICD-10-CM

## 2013-11-19 DIAGNOSIS — I10 Essential (primary) hypertension: Secondary | ICD-10-CM

## 2013-11-19 LAB — BASIC METABOLIC PANEL
BUN: 9 mg/dL (ref 6–23)
CO2: 26 mEq/L (ref 19–32)
Calcium: 9.5 mg/dL (ref 8.4–10.5)
Chloride: 103 mEq/L (ref 96–112)
Creatinine, Ser: 1 mg/dL (ref 0.4–1.5)
GFR: 85.41 mL/min (ref >60.00)
Glucose, Bld: 93 mg/dL (ref 70–99)
POTASSIUM: 4.1 meq/L (ref 3.5–5.1)
SODIUM: 136 meq/L (ref 135–145)

## 2013-11-19 LAB — CBC WITH DIFFERENTIAL/PLATELET
Basophils Absolute: 0.1 10*3/uL (ref 0.0–0.1)
Basophils Relative: 0.8 % (ref 0.0–3.0)
EOS ABS: 0.2 10*3/uL (ref 0.0–0.7)
Eosinophils Relative: 2.4 % (ref 0.0–5.0)
HCT: 47.2 % (ref 39.0–52.0)
Hemoglobin: 16.1 g/dL (ref 13.0–17.0)
LYMPHS PCT: 23.3 % (ref 12.0–46.0)
Lymphs Abs: 2.1 10*3/uL (ref 0.7–4.0)
MCHC: 34.1 g/dL (ref 30.0–36.0)
MCV: 89.4 fl (ref 78.0–100.0)
MONO ABS: 0.8 10*3/uL (ref 0.1–1.0)
Monocytes Relative: 9.2 % (ref 3.0–12.0)
NEUTROS ABS: 5.9 10*3/uL (ref 1.4–7.7)
Neutrophils Relative %: 64.3 % (ref 43.0–77.0)
Platelets: 286 10*3/uL (ref 150.0–400.0)
RBC: 5.27 Mil/uL (ref 4.22–5.81)
RDW: 13.6 % (ref 11.5–14.6)
WBC: 9.1 10*3/uL (ref 4.5–10.5)

## 2013-11-19 LAB — SEDIMENTATION RATE: Sed Rate: 19 mm/hr (ref 0–22)

## 2013-11-19 MED ORDER — ROSUVASTATIN CALCIUM 20 MG PO TABS: 20.0000 mg | ORAL_TABLET | Freq: Every day | ORAL | Status: DC

## 2013-11-19 NOTE — Progress Notes (Signed)
   Subjective:    Patient ID: Blake Anderson, male    DOB: [birth year], 58 y.o.   MRN: [id number]  Hypertension This is a chronic problem. The current episode started more than 1 year ago. The problem has been gradually improving since onset. The problem is controlled. Pertinent negatives include no anxiety, blurred vision, chest pain, headaches, malaise/fatigue, neck pain, orthopnea, palpitations, peripheral edema, PND, shortness of breath or sweats. There are no associated agents to hypertension. Past treatments include ACE inhibitors and diuretics. The current treatment provides moderate improvement. There are no compliance problems.       Review of Systems  Constitutional: Negative.  Negative for fever, chills, malaise/fatigue, diaphoresis, appetite change and fatigue.  HENT: Negative.  Negative for facial swelling, sore throat, trouble swallowing and voice change.   Eyes: Negative.  Negative for blurred vision.  Respiratory: Negative.  Negative for shortness of breath.   Cardiovascular: Negative.  Negative for chest pain, palpitations, orthopnea, leg swelling and PND.  Gastrointestinal: Negative.  Negative for nausea, vomiting, abdominal pain, diarrhea and constipation.  Endocrine: Negative.   Genitourinary: Negative.   Musculoskeletal: Negative.  Negative for arthralgias, back pain, gait problem, joint swelling, myalgias, neck pain and neck stiffness.  Skin: Negative.  Negative for color change and rash.  Allergic/Immunologic: Negative.  Negative for immunocompromised state.  Neurological: Negative.  Negative for weakness, light-headedness and headaches.  Hematological: Negative.  Negative for adenopathy. Does not bruise/bleed easily.  Psychiatric/Behavioral: Negative.        Objective:   Physical Exam  Vitals reviewed. Constitutional: He is oriented to person, place, and time. He appears well-developed and well-nourished. No distress.  HENT:  Head: Normocephalic and atraumatic.    Mouth/Throat: Oropharynx is clear and moist. No oropharyngeal exudate.  Eyes: Conjunctivae are normal. Right eye exhibits no discharge. Left eye exhibits no discharge. No scleral icterus.  Neck: Normal range of motion. Neck supple. No JVD present. No tracheal deviation present. No thyromegaly present.  Cardiovascular: Normal rate, regular rhythm, normal heart sounds and intact distal pulses.  Exam reveals no gallop and no friction rub.   No murmur heard. Pulmonary/Chest: Effort normal and breath sounds normal. No stridor. No respiratory distress. He has no wheezes. He has no rales. He exhibits no tenderness.  Abdominal: Soft. Bowel sounds are normal. He exhibits no distension and no mass. There is no tenderness. There is no rebound and no guarding.  Musculoskeletal: Normal range of motion. He exhibits no edema and no tenderness.  Lymphadenopathy:    He has no cervical adenopathy.  Neurological: He is oriented to person, place, and time.  Skin: Skin is warm and dry. No rash noted. He is not diaphoretic. No erythema. No pallor.     Lab Results  Component Value Date   WBC 13.0* 08/11/2013   HGB 16.7 08/11/2013   HCT 49.0 08/11/2013   PLT 252 08/11/2013   GLUCOSE 97 08/11/2013   CHOL 261* 05/14/2013   TRIG 203.0* 05/14/2013   HDL 45.20 05/14/2013   LDLDIRECT 180.4 05/14/2013   ALT 49 05/14/2013   AST 36 05/14/2013   NA 138 08/11/2013   K 3.9 08/11/2013   CL 101 08/11/2013   CREATININE 0.80 08/11/2013   BUN 8 08/11/2013   CO2 24 05/14/2013   TSH 1.63 05/14/2013   PSA 0.66 05/14/2013   HGBA1C 5.7 10/07/2011       Assessment & Plan:

## 2013-11-19 NOTE — Assessment & Plan Note (Signed)
He has several risk factors for MI and CVA so I have asked him to start crestor

## 2013-11-19 NOTE — Assessment & Plan Note (Signed)
He has no suspicious s/s I will recheck his CBC and will look at his SPEP as well to screen for lymphoproliferative disease

## 2013-11-19 NOTE — Patient Instructions (Signed)

## 2013-11-19 NOTE — Progress Notes (Signed)
Pre visit review using our clinic review tool, if applicable. No additional management support is needed unless otherwise documented below in the visit note. 

## 2013-11-19 NOTE — Assessment & Plan Note (Signed)
His BP is well controlled Today I will check his lytes and renal function 

## 2013-11-23 LAB — SPEP & IFE WITH QIG
ALPHA-1-GLOBULIN: 4.4 % (ref 2.9–4.9)
ALPHA-2-GLOBULIN: 9.6 % (ref 7.1–11.8)
Albumin ELP: 62.4 % (ref 55.8–66.1)
BETA 2: 4 % (ref 3.2–6.5)
Beta Globulin: 7.9 % — ABNORMAL HIGH (ref 4.7–7.2)
GAMMA GLOBULIN: 11.7 % (ref 11.1–18.8)
IgA: 168 mg/dL (ref 68–379)
IgG (Immunoglobin G), Serum: 902 mg/dL (ref 650–1600)
IgM, Serum: 135 mg/dL (ref 41–251)
Total Protein, Serum Electrophoresis: 6.9 g/dL (ref 6.0–8.3)

## 2014-01-19 ENCOUNTER — Other Ambulatory Visit: Payer: Self-pay | Admitting: Internal Medicine

## 2014-01-21 ENCOUNTER — Other Ambulatory Visit (INDEPENDENT_AMBULATORY_CARE_PROVIDER_SITE_OTHER): Payer: Commercial Managed Care - PPO

## 2014-01-21 ENCOUNTER — Encounter: Payer: Self-pay | Admitting: Internal Medicine

## 2014-01-21 ENCOUNTER — Ambulatory Visit (INDEPENDENT_AMBULATORY_CARE_PROVIDER_SITE_OTHER): Payer: Commercial Managed Care - PPO | Admitting: Internal Medicine

## 2014-01-21 VITALS — BP 120/80 | HR 71 | Temp 98.1°F | Resp 16 | Ht 63.0 in | Wt 195.6 lb

## 2014-01-21 DIAGNOSIS — E785 Hyperlipidemia, unspecified: Secondary | ICD-10-CM

## 2014-01-21 DIAGNOSIS — I10 Essential (primary) hypertension: Secondary | ICD-10-CM

## 2014-01-21 LAB — LIPID PANEL
CHOLESTEROL: 126 mg/dL (ref 0–200)
HDL: 42.3 mg/dL (ref >39.00)
LDL CALC: 59 mg/dL (ref 0–99)
NonHDL: 83.7
TRIGLYCERIDES: 125 mg/dL (ref 0.0–149.0)
Total CHOL/HDL Ratio: 3
VLDL: 25 mg/dL (ref 0.0–40.0)

## 2014-01-21 LAB — COMPREHENSIVE METABOLIC PANEL
ALK PHOS: 68 U/L (ref 39–117)
ALT: 36 U/L (ref 0–53)
AST: 32 U/L (ref 0–37)
Albumin: 4.1 g/dL (ref 3.5–5.2)
BILIRUBIN TOTAL: 0.9 mg/dL (ref 0.2–1.2)
BUN: 11 mg/dL (ref 6–23)
CO2: 26 meq/L (ref 19–32)
CREATININE: 1 mg/dL (ref 0.4–1.5)
Calcium: 9.5 mg/dL (ref 8.4–10.5)
Chloride: 104 mEq/L (ref 96–112)
GFR: 80.5 mL/min (ref >60.00)
GLUCOSE: 97 mg/dL (ref 70–99)
Potassium: 4.5 mEq/L (ref 3.5–5.1)
Sodium: 137 mEq/L (ref 135–145)
Total Protein: 7.4 g/dL (ref 6.0–8.3)

## 2014-01-21 NOTE — Progress Notes (Signed)
Pre visit review using our clinic review tool, if applicable. No additional management support is needed unless otherwise documented below in the visit note. 

## 2014-01-21 NOTE — Progress Notes (Signed)
   Subjective:    Patient ID: Blake RhineMark Anderson, male    DOB: 07/31/1956, 58 y.o.   MRN: [id number]  Hyperlipidemia This is a chronic problem. The current episode started more than 1 year ago. The problem is controlled. Recent lipid tests were reviewed and are variable. Exacerbating diseases include obesity. He has no history of chronic renal disease, diabetes, hypothyroidism, liver disease or nephrotic syndrome. Factors aggravating his hyperlipidemia include fatty foods. Pertinent negatives include no chest pain, focal sensory loss, focal weakness, leg pain, myalgias or shortness of breath. Current antihyperlipidemic treatment includes statins, exercise and diet change. The current treatment provides significant improvement of lipids. There are no compliance problems.       Review of Systems  Constitutional: Negative.  Negative for fever, chills, diaphoresis, appetite change and fatigue.  HENT: Negative.   Eyes: Negative.   Respiratory: Negative.  Negative for cough, choking, chest tightness, shortness of breath and stridor.   Cardiovascular: Negative.  Negative for chest pain, palpitations and leg swelling.  Gastrointestinal: Negative.  Negative for nausea, vomiting, abdominal pain, diarrhea and constipation.  Endocrine: Negative.   Genitourinary: Negative.   Musculoskeletal: Negative.  Negative for arthralgias, back pain, myalgias and neck stiffness.  Skin: Negative.   Allergic/Immunologic: Negative.   Neurological: Negative.  Negative for dizziness and focal weakness.  Hematological: Negative.  Negative for adenopathy. Does not bruise/bleed easily.  Psychiatric/Behavioral: Negative.        Objective:   Physical Exam  Vitals reviewed. Constitutional: He is oriented to person, place, and time. He appears well-developed and well-nourished. No distress.  HENT:  Head: Normocephalic and atraumatic.  Mouth/Throat: Oropharynx is clear and moist. No oropharyngeal exudate.  Eyes:  Conjunctivae are normal. Right eye exhibits no discharge. Left eye exhibits no discharge. No scleral icterus.  Neck: Normal range of motion. Neck supple. No JVD present. No tracheal deviation present. No thyromegaly present.  Cardiovascular: Normal rate, regular rhythm, normal heart sounds and intact distal pulses.  Exam reveals no gallop and no friction rub.   No murmur heard. Pulmonary/Chest: Effort normal and breath sounds normal. No stridor. No respiratory distress. He has no wheezes. He has no rales. He exhibits no tenderness.  Abdominal: Soft. Bowel sounds are normal. He exhibits no distension and no mass. There is no tenderness. There is no rebound and no guarding.  Musculoskeletal: Normal range of motion. He exhibits no edema and no tenderness.  Lymphadenopathy:    He has no cervical adenopathy.  Neurological: He is oriented to person, place, and time.  Skin: Skin is warm and dry. No rash noted. He is not diaphoretic. No erythema. No pallor.      Lab Results  Component Value Date   WBC 9.1 11/19/2013   HGB 16.1 11/19/2013   HCT 47.2 11/19/2013   PLT 286.0 11/19/2013   GLUCOSE 93 11/19/2013   CHOL 261* 05/14/2013   TRIG 203.0* 05/14/2013   HDL 45.20 05/14/2013   LDLDIRECT 180.4 05/14/2013   ALT 49 05/14/2013   AST 36 05/14/2013   NA 136 11/19/2013   K 4.1 11/19/2013   CL 103 11/19/2013   CREATININE 1.0 11/19/2013   BUN 9 11/19/2013   CO2 26 11/19/2013   TSH 1.63 05/14/2013   PSA 0.66 05/14/2013   HGBA1C 5.7 10/07/2011      Assessment & Plan:

## 2014-01-21 NOTE — Patient Instructions (Signed)

## 2014-01-22 ENCOUNTER — Encounter: Payer: Self-pay | Admitting: Internal Medicine

## 2014-01-23 NOTE — Assessment & Plan Note (Signed)
His BP is well controlled His lytes and renal function are stable 

## 2014-01-23 NOTE — Assessment & Plan Note (Signed)
He is doing well on crestor and has achieved his LDL goal

## 2014-03-24 ENCOUNTER — Telehealth: Payer: Self-pay | Admitting: Internal Medicine

## 2014-03-24 MED ORDER — ROSUVASTATIN CALCIUM 20 MG PO TABS: 20.0000 mg | ORAL_TABLET | Freq: Every day | ORAL | Status: DC

## 2014-03-24 NOTE — Telephone Encounter (Signed)
RX approved.

## 2014-03-24 NOTE — Telephone Encounter (Signed)
Patient states that he was instructed to call our office when he finished the samples he was given of Crestor. He would like a prescription to be sent to [facility] on [address] for this drug.

## 2014-04-21 ENCOUNTER — Other Ambulatory Visit: Payer: Self-pay | Admitting: Internal Medicine

## 2014-05-23 ENCOUNTER — Other Ambulatory Visit: Payer: Self-pay

## 2014-05-23 MED ORDER — LISINOPRIL-HYDROCHLOROTHIAZIDE 10-12.5 MG PO TABS: ORAL_TABLET | ORAL | Status: DC

## 2014-07-22 ENCOUNTER — Ambulatory Visit: Payer: Commercial Managed Care - PPO | Admitting: Internal Medicine

## 2014-08-19 ENCOUNTER — Ambulatory Visit: Payer: Commercial Managed Care - PPO | Admitting: Internal Medicine

## 2014-09-19 ENCOUNTER — Encounter: Payer: Self-pay | Admitting: Internal Medicine

## 2014-09-19 ENCOUNTER — Other Ambulatory Visit (INDEPENDENT_AMBULATORY_CARE_PROVIDER_SITE_OTHER): Payer: Commercial Managed Care - PPO

## 2014-09-19 ENCOUNTER — Ambulatory Visit (INDEPENDENT_AMBULATORY_CARE_PROVIDER_SITE_OTHER): Payer: Commercial Managed Care - PPO | Admitting: Internal Medicine

## 2014-09-19 VITALS — BP 118/78 | HR 84 | Temp 97.8°F | Resp 16 | Wt 202.0 lb

## 2014-09-19 DIAGNOSIS — L301 Dyshidrosis [pompholyx]: Secondary | ICD-10-CM | POA: Insufficient documentation

## 2014-09-19 DIAGNOSIS — E785 Hyperlipidemia, unspecified: Secondary | ICD-10-CM

## 2014-09-19 DIAGNOSIS — I10 Essential (primary) hypertension: Secondary | ICD-10-CM

## 2014-09-19 DIAGNOSIS — Z23 Encounter for immunization: Secondary | ICD-10-CM

## 2014-09-19 LAB — BASIC METABOLIC PANEL
BUN: 13 mg/dL (ref 6–23)
CALCIUM: 9.9 mg/dL (ref 8.4–10.5)
CO2: 24 meq/L (ref 19–32)
CREATININE: 1.08 mg/dL (ref 0.40–1.50)
Chloride: 102 mEq/L (ref 96–112)
GFR: 74.34 mL/min (ref >60.00)
GLUCOSE: 91 mg/dL (ref 70–99)
Potassium: 4 mEq/L (ref 3.5–5.1)
Sodium: 136 mEq/L (ref 135–145)

## 2014-09-19 MED ORDER — ROSUVASTATIN CALCIUM 20 MG PO TABS: 20.0000 mg | ORAL_TABLET | Freq: Every day | ORAL | Status: DC

## 2014-09-19 MED ORDER — TRIAMCINOLONE ACETONIDE 0.5 % EX CREA: 1.0000 "application " | TOPICAL_CREAM | Freq: Three times a day (TID) | CUTANEOUS | Status: DC

## 2014-09-19 MED ORDER — LISINOPRIL-HYDROCHLOROTHIAZIDE 10-12.5 MG PO TABS: ORAL_TABLET | ORAL | Status: DC

## 2014-09-19 NOTE — Progress Notes (Signed)
Pre visit review using our clinic review tool, if applicable. No additional management support is needed unless otherwise documented below in the visit note. 

## 2014-09-19 NOTE — Assessment & Plan Note (Signed)
Will treat this with TAC cream He was given pt ed material about this

## 2014-09-19 NOTE — Progress Notes (Signed)
Subjective:    Patient ID: Blake Anderson, male    DOB: 31-Mar-1956, 59 y.o.   MRN: [id number]  Hypertension This is a chronic problem. The current episode started more than 1 year ago. The problem is unchanged. The problem is controlled. Pertinent negatives include no anxiety, blurred vision, chest pain, headaches, malaise/fatigue, neck pain, orthopnea, palpitations, peripheral edema, PND, shortness of breath or sweats. There are no associated agents to hypertension. Past treatments include ACE inhibitors and diuretics. The current treatment provides moderate improvement. Compliance problems include diet and exercise.   Rash This is a chronic problem. The current episode started more than 1 month ago. The problem is unchanged. The affected locations include the right lower leg. The rash is characterized by scaling, itchiness and dryness. He was exposed to nothing. Pertinent negatives include no anorexia, congestion, cough, diarrhea, eye pain, facial edema, fatigue, fever, joint pain, nail changes, rhinorrhea, shortness of breath, sore throat or vomiting. Past treatments include nothing. The treatment provided no relief. His past medical history is significant for allergies and eczema. There is no history of asthma or varicella.      Review of Systems  Constitutional: Positive for unexpected weight change (some wt gain). Negative for fever, chills, malaise/fatigue, diaphoresis, appetite change and fatigue.  HENT: Negative.  Negative for congestion, rhinorrhea and sore throat.   Eyes: Negative.  Negative for blurred vision and pain.  Respiratory: Negative.  Negative for cough, choking, chest tightness, shortness of breath and stridor.   Cardiovascular: Negative.  Negative for chest pain, palpitations, orthopnea, leg swelling and PND.  Gastrointestinal: Negative.  Negative for vomiting, abdominal pain, diarrhea and anorexia.  Endocrine: Negative.   Genitourinary: Negative.   Musculoskeletal:  Negative.  Negative for myalgias, back pain, joint pain, joint swelling, gait problem and neck pain.  Skin: Positive for rash. Negative for nail changes, color change, pallor and wound.  Allergic/Immunologic: Negative.   Neurological: Negative.  Negative for dizziness, tremors, syncope, light-headedness and headaches.  Hematological: Negative.  Negative for adenopathy. Does not bruise/bleed easily.  Psychiatric/Behavioral: Negative.   All other systems reviewed and are negative.      Objective:   Physical Exam  Constitutional: He is oriented to person, place, and time. He appears well-developed and well-nourished. No distress.  HENT:  Head: Normocephalic and atraumatic.  Mouth/Throat: Oropharynx is clear and moist. No oropharyngeal exudate.  Eyes: Conjunctivae are normal. Right eye exhibits no discharge. Left eye exhibits no discharge. No scleral icterus.  Neck: Normal range of motion. Neck supple. No JVD present. No tracheal deviation present. No thyromegaly present.  Cardiovascular: Normal rate, regular rhythm, normal heart sounds and intact distal pulses.  Exam reveals no gallop and no friction rub.   No murmur heard. Pulmonary/Chest: Effort normal and breath sounds normal. No stridor. No respiratory distress. He has no wheezes. He has no rales. He exhibits no tenderness.  Abdominal: Soft. Bowel sounds are normal. He exhibits no distension and no mass. There is no tenderness. There is no rebound and no guarding.  Musculoskeletal: Normal range of motion. He exhibits no edema or tenderness.       Legs: Lymphadenopathy:    He has no cervical adenopathy.  Neurological: He is oriented to person, place, and time.  Skin: Skin is warm, dry and intact. Rash noted. No purpura noted. Rash is papular. Rash is not macular, not maculopapular, not nodular, not pustular, not vesicular and not urticarial. He is not diaphoretic. No pallor.  Vitals reviewed.    Lab  Results  Component Value Date    WBC 9.1 11/19/2013   HGB 16.1 11/19/2013   HCT 47.2 11/19/2013   PLT 286.0 11/19/2013   GLUCOSE 97 01/21/2014   CHOL 126 01/21/2014   TRIG 125.0 01/21/2014   HDL 42.30 01/21/2014   LDLDIRECT 180.4 05/14/2013   LDLCALC 59 01/21/2014   ALT 36 01/21/2014   AST 32 01/21/2014   NA 137 01/21/2014   K 4.5 01/21/2014   CL 104 01/21/2014   CREATININE 1.0 01/21/2014   BUN 11 01/21/2014   CO2 26 01/21/2014   TSH 1.63 05/14/2013   PSA 0.66 05/14/2013   HGBA1C 5.7 10/07/2011       Assessment & Plan:

## 2014-09-19 NOTE — Patient Instructions (Signed)
Eczema Eczema, also called atopic dermatitis, is a skin disorder that causes inflammation of the skin. It causes a red rash and dry, scaly skin. The skin becomes very itchy. Eczema is generally worse during the cooler winter months and often improves with the warmth of summer. Eczema usually starts showing signs in infancy. Some children outgrow eczema, but it may last through adulthood.  CAUSES  The exact cause of eczema is not known, but it appears to run in families. People with eczema often have a family history of eczema, allergies, asthma, or hay fever. Eczema is not contagious. Flare-ups of the condition may be caused by:   Contact with something you are sensitive or allergic to.   Stress. SIGNS AND SYMPTOMS  Dry, scaly skin.   Red, itchy rash.   Itchiness. This may occur before the skin rash and may be very intense.  DIAGNOSIS  The diagnosis of eczema is usually made based on symptoms and medical history. TREATMENT  Eczema cannot be cured, but symptoms usually can be controlled with treatment and other strategies. A treatment plan might include:  Controlling the itching and scratching.   Use over-the-counter antihistamines as directed for itching. This is especially useful at night when the itching tends to be worse.   Use over-the-counter steroid creams as directed for itching.   Avoid scratching. Scratching makes the rash and itching worse. It may also result in a skin infection (impetigo) due to a break in the skin caused by scratching.   Keeping the skin well moisturized with creams every day. This will seal in moisture and help prevent dryness. Lotions that contain alcohol and water should be avoided because they can dry the skin.   Limiting exposure to things that you are sensitive or allergic to (allergens).   Recognizing situations that cause stress.   Developing a plan to manage stress.  HOME CARE INSTRUCTIONS   Only take over-the-counter or  prescription medicines as directed by your health care provider.   Do not use anything on the skin without checking with your health care provider.   Keep baths or showers short (5 minutes) in warm (not hot) water. Use mild cleansers for bathing. These should be unscented. You may add nonperfumed bath oil to the bath water. It is best to avoid soap and bubble bath.   Immediately after a bath or shower, when the skin is still damp, apply a moisturizing ointment to the entire body. This ointment should be a petroleum ointment. This will seal in moisture and help prevent dryness. The thicker the ointment, the better. These should be unscented.   Keep fingernails cut short. Children with eczema may need to wear soft gloves or mittens at night after applying an ointment.   Dress in clothes made of cotton or cotton blends. Dress lightly, because heat increases itching.   A child with eczema should stay away from anyone with fever blisters or cold sores. The virus that causes fever blisters (herpes simplex) can cause a serious skin infection in children with eczema. SEEK MEDICAL CARE IF:   Your itching interferes with sleep.   Your rash gets worse or is not better within 1 week after starting treatment.   You see pus or soft yellow scabs in the rash area.   You have a fever.   You have a rash flare-up after contact with someone who has fever blisters.  Document Released: 07/26/2000 Document Revised: 05/19/2013 Document Reviewed: 03/01/2013 [employer] Patient Information 2015 [employer]. This information   is not intended to replace advice given to you by your health care provider. Make sure you discuss any questions you have with your health care provider.  

## 2014-09-19 NOTE — Assessment & Plan Note (Signed)
His BP is well controlled I will monitor his lytes and renal function today 

## 2014-10-16 ENCOUNTER — Other Ambulatory Visit: Payer: Self-pay | Admitting: Internal Medicine

## 2015-01-19 ENCOUNTER — Encounter: Payer: Self-pay | Admitting: Internal Medicine

## 2015-01-19 ENCOUNTER — Ambulatory Visit (INDEPENDENT_AMBULATORY_CARE_PROVIDER_SITE_OTHER)
Admission: RE | Admit: 2015-01-19 | Discharge: 2015-01-19 | Disposition: A | Discharge status: Home / Self Care | Payer: Commercial Managed Care - PPO | Source: Ambulatory Visit | Attending: Internal Medicine | Admitting: Internal Medicine

## 2015-01-19 ENCOUNTER — Other Ambulatory Visit (INDEPENDENT_AMBULATORY_CARE_PROVIDER_SITE_OTHER): Payer: Commercial Managed Care - PPO

## 2015-01-19 ENCOUNTER — Ambulatory Visit (INDEPENDENT_AMBULATORY_CARE_PROVIDER_SITE_OTHER): Payer: Commercial Managed Care - PPO | Admitting: Internal Medicine

## 2015-01-19 VITALS — BP 132/94 | HR 84 | Temp 98.7°F | Resp 16 | Ht 63.0 in | Wt 194.0 lb

## 2015-01-19 DIAGNOSIS — R05 Cough: Secondary | ICD-10-CM

## 2015-01-19 DIAGNOSIS — R5383 Other fatigue: Secondary | ICD-10-CM

## 2015-01-19 DIAGNOSIS — I10 Essential (primary) hypertension: Secondary | ICD-10-CM

## 2015-01-19 DIAGNOSIS — R059 Cough, unspecified: Secondary | ICD-10-CM

## 2015-01-19 LAB — COMPREHENSIVE METABOLIC PANEL
ALK PHOS: 81 U/L (ref 39–117)
ALT: 25 U/L (ref 0–53)
AST: 22 U/L (ref 0–37)
Albumin: 4.2 g/dL (ref 3.5–5.2)
BUN: 9 mg/dL (ref 6–23)
CHLORIDE: 104 meq/L (ref 96–112)
CO2: 26 mEq/L (ref 19–32)
Calcium: 9.7 mg/dL (ref 8.4–10.5)
Creatinine, Ser: 0.95 mg/dL (ref 0.40–1.50)
GFR: 86.1 mL/min (ref >60.00)
Glucose, Bld: 91 mg/dL (ref 70–99)
Potassium: 4.2 mEq/L (ref 3.5–5.1)
SODIUM: 136 meq/L (ref 135–145)
Total Bilirubin: 0.7 mg/dL (ref 0.2–1.2)
Total Protein: 7.2 g/dL (ref 6.0–8.3)

## 2015-01-19 LAB — CBC WITH DIFFERENTIAL/PLATELET
Basophils Absolute: 0 10*3/uL (ref 0.0–0.1)
Basophils Relative: 0.2 % (ref 0.0–3.0)
Eosinophils Absolute: 0.3 10*3/uL (ref 0.0–0.7)
Eosinophils Relative: 2.9 % (ref 0.0–5.0)
HEMATOCRIT: 47.5 % (ref 39.0–52.0)
HEMOGLOBIN: 16.1 g/dL (ref 13.0–17.0)
LYMPHS PCT: 23.1 % (ref 12.0–46.0)
Lymphs Abs: 2.3 10*3/uL (ref 0.7–4.0)
MCHC: 33.8 g/dL (ref 30.0–36.0)
MCV: 88.8 fl (ref 78.0–100.0)
Monocytes Absolute: 0.9 10*3/uL (ref 0.1–1.0)
Monocytes Relative: 8.9 % (ref 3.0–12.0)
Neutro Abs: 6.5 10*3/uL (ref 1.4–7.7)
Neutrophils Relative %: 64.9 % (ref 43.0–77.0)
Platelets: 280 10*3/uL (ref 150.0–400.0)
RBC: 5.34 Mil/uL (ref 4.22–5.81)
RDW: 13.6 % (ref 11.5–15.5)
WBC: 10 10*3/uL (ref 4.0–10.5)

## 2015-01-19 LAB — CARDIAC PANEL
CK MB: 1.1 ng/mL (ref 0.3–4.0)
CK TOTAL: 41 U/L (ref 7–232)
Relative Index: 2.7 calc — ABNORMAL HIGH (ref 0.0–2.5)

## 2015-01-19 LAB — TROPONIN I: TNIDX: 0.01 ug/l (ref 0.00–0.06)

## 2015-01-19 LAB — TSH: TSH: 1.64 u[IU]/mL (ref 0.35–4.50)

## 2015-01-19 MED ORDER — AZILSARTAN-CHLORTHALIDONE 40-12.5 MG PO TABS
1.0000 | ORAL_TABLET | Freq: Every day | ORAL | Status: DC
Start: 2015-01-19 — End: 2015-12-23

## 2015-01-19 NOTE — Progress Notes (Signed)
Pre visit review using our clinic review tool, if applicable. No additional management support is needed unless otherwise documented below in the visit note. 

## 2015-01-19 NOTE — Patient Instructions (Signed)

## 2015-01-19 NOTE — Progress Notes (Signed)
Subjective:  Patient ID: Blake Anderson, male    DOB: 1955-11-28  Age: 59 y.o. MRN: [id number]  CC: Hypertension and Cough   HPI Blake Anderson presents for the complaint of a one month hx of NP cough with fatigue and SOB/DOE.  Outpatient Prescriptions Prior to Visit  Medication Sig Dispense Refill  . aspirin 81 MG tablet Take 81 mg by mouth daily.    . CRESTOR 20 MG tablet TAKE 1 TABLET BY MOUTH DAILY 30 tablet 6  . GLUCOSAMINE-CHONDROITIN PO Take 1,500 mg by mouth.     . Multiple Vitamins-Minerals (MULTIVITAMIN WITH MINERALS) tablet Take 1 tablet by mouth daily.    . Omega 3-6-9 Fatty Acids (OMEGA 3-6-9 COMPLEX PO) Take 1,500 mg by mouth.    Marland Kitchen lisinopril-hydrochlorothiazide (PRINZIDE,ZESTORETIC) 10-12.5 MG per tablet TAKE 1 TABLET BY MOUTH EVERY DAY 90 tablet 2  . albuterol (PROVENTIL HFA;VENTOLIN HFA) 108 (90 BASE) MCG/ACT inhaler Inhale 2 puffs into the lungs every 4 (four) hours as needed for shortness of breath. (Patient not taking: Reported on 01/19/2015) 1 Inhaler 0  . triamcinolone cream (KENALOG) 0.5 % Apply 1 application topically 3 (three) times daily. (Patient not taking: Reported on 01/19/2015) 30 g 2   No facility-administered medications prior to visit.    ROS Review of Systems  Constitutional: Positive for fatigue. Negative for chills, diaphoresis, activity change, appetite change and unexpected weight change.  HENT: Negative.  Negative for postnasal drip, sore throat, trouble swallowing and voice change.   Eyes: Negative.   Respiratory: Positive for cough and shortness of breath. Negative for apnea, choking, chest tightness, wheezing and stridor.   Cardiovascular: Negative.  Negative for chest pain, palpitations and leg swelling.  Gastrointestinal: Negative.  Negative for nausea, vomiting, abdominal pain, diarrhea, constipation and blood in stool.  Endocrine: Negative.   Genitourinary: Negative.   Musculoskeletal: Negative.  Negative for myalgias, arthralgias and neck  pain.  Skin: Negative.  Negative for rash.  Allergic/Immunologic: Negative.   Neurological: Negative.  Negative for dizziness, tremors, syncope, light-headedness, numbness and headaches.  Hematological: Negative.  Negative for adenopathy. Does not bruise/bleed easily.  Psychiatric/Behavioral: Negative.     Objective:  BP 132/94 mmHg  Pulse 84  Temp(Src) 98.7 F (37.1 C) (Oral)  Resp 16  Ht  (1.6 m)  Wt 194 lb (87.998 kg)  BMI 34.37 kg/m2  SpO2 97%  BP Readings from Last 3 Encounters:  01/19/15 132/94  09/19/14 118/78  01/21/14 120/80    Wt Readings from Last 3 Encounters:  01/19/15 194 lb (87.998 kg)  09/19/14 202 lb (91.627 kg)  01/21/14 195 lb 9.6 oz (88.724 kg)    Physical Exam  Constitutional: He is oriented to person, place, and time. He appears well-developed and well-nourished. No distress.  HENT:  Head: Normocephalic and atraumatic.  Mouth/Throat: Oropharynx is clear and moist. No oropharyngeal exudate.  Eyes: Conjunctivae are normal. Right eye exhibits no discharge. Left eye exhibits no discharge. No scleral icterus.  Neck: Normal range of motion. Neck supple. No JVD present. No tracheal deviation present. No thyromegaly present.  Cardiovascular: Normal rate, regular rhythm, normal heart sounds and intact distal pulses.  Exam reveals no gallop and no friction rub.   No murmur heard. Pulmonary/Chest: Effort normal and breath sounds normal. No stridor. No respiratory distress. He has no wheezes. He has no rales. He exhibits no tenderness.  Abdominal: Soft. Bowel sounds are normal. He exhibits no distension and no mass. There is no tenderness. There is no rebound and  no guarding.  Musculoskeletal: Normal range of motion. He exhibits no edema or tenderness.  Lymphadenopathy:    He has no cervical adenopathy.  Neurological: He is oriented to person, place, and time.  Skin: Skin is warm and dry. No rash noted. He is not diaphoretic. No erythema. No pallor.    Psychiatric: He has a normal mood and affect. His behavior is normal. Judgment and thought content normal.  Vitals reviewed.   Lab Results  Component Value Date   WBC 10.0 01/19/2015   HGB 16.1 01/19/2015   HCT 47.5 01/19/2015   PLT 280.0 01/19/2015   GLUCOSE 91 01/19/2015   CHOL 126 01/21/2014   TRIG 125.0 01/21/2014   HDL 42.30 01/21/2014   LDLDIRECT 180.4 05/14/2013   LDLCALC 59 01/21/2014   ALT 25 01/19/2015   AST 22 01/19/2015   NA 136 01/19/2015   K 4.2 01/19/2015   CL 104 01/19/2015   CREATININE 0.95 01/19/2015   BUN 9 01/19/2015   CO2 26 01/19/2015   TSH 1.64 01/19/2015   PSA 0.66 05/14/2013   HGBA1C 5.7 10/07/2011    Dg Chest 2 View  01/19/2015   CLINICAL DATA:  Four week history of cough chills dizziness and fatigue, history of bronchitis ; history of ischemic heart disease  EXAM: CHEST  2 VIEW  COMPARISON:  PA and lateral chest of August 11, 2013  FINDINGS: The lungs are adequately inflated. There is an azygos lobe anatomy. The heart is top-normal in size but stable. The pulmonary vascularity is normal. There is no pleural effusion. There is calcification of the anterior longitudinal ligament of the thoracic spine.  IMPRESSION: There is no active cardiopulmonary disease.   Electronically Signed   By: David  [place] M.D.   On: 01/19/2015 10:43   EKG - NSR with no LVH, no Q waves, and no ST/T wave changes  Assessment & Plan:   [name] was seen today for hypertension and cough.  Diagnoses and all orders for this visit:  Essential hypertension, benign - he has a cough and his BP is not well controlled, will stop the ACEI and will control his BP with an ARB/HCTZ combo Orders: -     Comprehensive metabolic panel; Future -     Azilsartan-Chlorthalidone (EDARBYCLOR) 40-12.5 MG TABS; Take 1 tablet by mouth daily.  Cough - his exam and the CXR are normal, I think the cough is caused by the ACEI so will discontinue it Orders: -     DG Chest 2 View; Future  Other  fatigue - his EKG is normal, will check labs to screen for cardiac ischemia, will check labs to look for other causes of fatigue Orders: -     CBC with Differential/Platelet; Future -     Comprehensive metabolic panel; Future -     Troponin I; Future -     Cardiac panel; Future -     TSH; Future -     EKG 12-Lead   I have discontinued Mr. Sport albuterol, triamcinolone cream, and lisinopril-hydrochlorothiazide. I am also having him start on Azilsartan-Chlorthalidone. Additionally, I am having him maintain his multivitamin with minerals, aspirin, GLUCOSAMINE-CHONDROITIN PO, Omega 3-6-9 Fatty Acids (OMEGA 3-6-9 COMPLEX PO), and CRESTOR.  Meds ordered this encounter  Medications  . Azilsartan-Chlorthalidone (EDARBYCLOR) 40-12.5 MG TABS    Sig: Take 1 tablet by mouth daily.    Dispense:  30 tablet    Refill:  11     Follow-up: Return in about 3 weeks (around 02/09/2015).  Scarlette Calico, MD

## 2015-01-20 ENCOUNTER — Ambulatory Visit: Payer: Commercial Managed Care - PPO | Admitting: Internal Medicine

## 2015-02-16 ENCOUNTER — Encounter: Payer: Self-pay | Admitting: Internal Medicine

## 2015-02-16 ENCOUNTER — Ambulatory Visit (INDEPENDENT_AMBULATORY_CARE_PROVIDER_SITE_OTHER): Payer: Commercial Managed Care - PPO | Admitting: Internal Medicine

## 2015-02-16 ENCOUNTER — Other Ambulatory Visit (INDEPENDENT_AMBULATORY_CARE_PROVIDER_SITE_OTHER): Payer: Commercial Managed Care - PPO

## 2015-02-16 VITALS — BP 122/68 | HR 70 | Temp 98.3°F | Resp 16 | Wt 193.0 lb

## 2015-02-16 DIAGNOSIS — I10 Essential (primary) hypertension: Secondary | ICD-10-CM

## 2015-02-16 DIAGNOSIS — E785 Hyperlipidemia, unspecified: Secondary | ICD-10-CM

## 2015-02-16 LAB — COMPREHENSIVE METABOLIC PANEL
ALBUMIN: 4 g/dL (ref 3.5–5.2)
ALT: 26 U/L (ref 0–53)
AST: 21 U/L (ref 0–37)
Alkaline Phosphatase: 80 U/L (ref 39–117)
BILIRUBIN TOTAL: 0.8 mg/dL (ref 0.2–1.2)
BUN: 12 mg/dL (ref 6–23)
CALCIUM: 9.7 mg/dL (ref 8.4–10.5)
CO2: 28 mEq/L (ref 19–32)
CREATININE: 0.99 mg/dL (ref 0.40–1.50)
Chloride: 102 mEq/L (ref 96–112)
GFR: 82.08 mL/min (ref >60.00)
GLUCOSE: 90 mg/dL (ref 70–99)
Potassium: 4.4 mEq/L (ref 3.5–5.1)
Sodium: 139 mEq/L (ref 135–145)
Total Protein: 7.1 g/dL (ref 6.0–8.3)

## 2015-02-16 LAB — LIPID PANEL
CHOL/HDL RATIO: 3
Cholesterol: 140 mg/dL (ref 0–200)
HDL: 44.1 mg/dL (ref >39.00)
LDL Cholesterol: 73 mg/dL (ref 0–99)
NONHDL: 95.9
Triglycerides: 113 mg/dL (ref 0.0–149.0)
VLDL: 22.6 mg/dL (ref 0.0–40.0)

## 2015-02-16 LAB — CBC WITH DIFFERENTIAL/PLATELET
BASOS PCT: 0.5 % (ref 0.0–3.0)
Basophils Absolute: 0.1 10*3/uL (ref 0.0–0.1)
Eosinophils Absolute: 0.2 10*3/uL (ref 0.0–0.7)
Eosinophils Relative: 1.5 % (ref 0.0–5.0)
HCT: 44.6 % (ref 39.0–52.0)
Hemoglobin: 15.1 g/dL (ref 13.0–17.0)
LYMPHS PCT: 17 % (ref 12.0–46.0)
Lymphs Abs: 2.4 10*3/uL (ref 0.7–4.0)
MCHC: 34 g/dL (ref 30.0–36.0)
MCV: 88.7 fl (ref 78.0–100.0)
Monocytes Absolute: 1.2 10*3/uL — ABNORMAL HIGH (ref 0.1–1.0)
Monocytes Relative: 8.8 % (ref 3.0–12.0)
NEUTROS PCT: 72.2 % (ref 43.0–77.0)
Neutro Abs: 10.2 10*3/uL — ABNORMAL HIGH (ref 1.4–7.7)
Platelets: 261 10*3/uL (ref 150.0–400.0)
RBC: 5.03 Mil/uL (ref 4.22–5.81)
RDW: 13.8 % (ref 11.5–15.5)
WBC: 14.2 10*3/uL — ABNORMAL HIGH (ref 4.0–10.5)

## 2015-02-16 LAB — TSH: TSH: 1.95 u[IU]/mL (ref 0.35–4.50)

## 2015-02-16 NOTE — Patient Instructions (Signed)

## 2015-02-16 NOTE — Progress Notes (Signed)
Pre visit review using our clinic review tool, if applicable. No additional management support is needed unless otherwise documented below in the visit note. 

## 2015-02-16 NOTE — Progress Notes (Signed)
Subjective:  Patient ID: Blake Anderson, male    DOB: 1955-11-20  Age: 59 y.o. MRN: [id number]  CC: Hypertension and Hyperlipidemia   HPI Blake Anderson presents for a BP check - his BP has been well controlled, he feels well and offers no complaints.  Outpatient Prescriptions Prior to Visit  Medication Sig Dispense Refill  . aspirin 81 MG tablet Take 81 mg by mouth daily.    . Azilsartan-Chlorthalidone (EDARBYCLOR) 40-12.5 MG TABS Take 1 tablet by mouth daily. 30 tablet 11  . CRESTOR 20 MG tablet TAKE 1 TABLET BY MOUTH DAILY 30 tablet 6  . GLUCOSAMINE-CHONDROITIN PO Take 1,500 mg by mouth.     . Multiple Vitamins-Minerals (MULTIVITAMIN WITH MINERALS) tablet Take 1 tablet by mouth daily.    . Omega 3-6-9 Fatty Acids (OMEGA 3-6-9 COMPLEX PO) Take 1,500 mg by mouth.     No facility-administered medications prior to visit.    ROS Review of Systems  Constitutional: Negative.  Negative for fever, chills, diaphoresis, appetite change and fatigue.  HENT: Negative.   Eyes: Negative.   Respiratory: Negative.  Negative for cough, choking, chest tightness, shortness of breath and stridor.   Cardiovascular: Negative.  Negative for chest pain, palpitations and leg swelling.  Gastrointestinal: Negative.  Negative for nausea, vomiting, abdominal pain, diarrhea, constipation and blood in stool.  Endocrine: Negative.   Genitourinary: Negative.  Negative for urgency, hematuria, decreased urine volume and difficulty urinating.  Musculoskeletal: Negative.  Negative for myalgias, back pain, joint swelling and arthralgias.  Skin: Negative.  Negative for rash.  Allergic/Immunologic: Negative.   Neurological: Negative.   Hematological: Negative.  Negative for adenopathy. Does not bruise/bleed easily.  Psychiatric/Behavioral: Negative.     Objective:  BP 122/68 mmHg  Pulse 70  Temp(Src) 98.3 F (36.8 C) (Oral)  Resp 16  Wt 193 lb (87.544 kg)  SpO2 96%  BP Readings from Last 3 Encounters:    02/16/15 122/68  01/19/15 132/94  09/19/14 118/78    Wt Readings from Last 3 Encounters:  02/16/15 193 lb (87.544 kg)  01/19/15 194 lb (87.998 kg)  09/19/14 202 lb (91.627 kg)    Physical Exam  Constitutional: He is oriented to person, place, and time. He appears well-developed and well-nourished. No distress.  HENT:  Mouth/Throat: Oropharynx is clear and moist. No oropharyngeal exudate.  Eyes: Conjunctivae are normal. Right eye exhibits no discharge. Left eye exhibits no discharge. No scleral icterus.  Neck: Normal range of motion. Neck supple. No JVD present. No tracheal deviation present. No thyromegaly present.  Cardiovascular: Normal rate, regular rhythm, normal heart sounds and intact distal pulses.  Exam reveals no gallop and no friction rub.   No murmur heard. Pulmonary/Chest: Effort normal and breath sounds normal. No stridor. No respiratory distress. He has no wheezes. He has no rales. He exhibits no tenderness.  Abdominal: Soft. Bowel sounds are normal. He exhibits no distension and no mass. There is no tenderness. There is no rebound and no guarding.  Musculoskeletal: Normal range of motion. He exhibits no edema or tenderness.  Lymphadenopathy:    He has no cervical adenopathy.  Neurological: He is oriented to person, place, and time.  Skin: Skin is warm and dry. No rash noted. He is not diaphoretic. No erythema. No pallor.  Vitals reviewed.   Lab Results  Component Value Date   WBC 14.2* 02/16/2015   HGB 15.1 02/16/2015   HCT 44.6 02/16/2015   PLT 261.0 02/16/2015   GLUCOSE 90 02/16/2015   CHOL  140 02/16/2015   TRIG 113.0 02/16/2015   HDL 44.10 02/16/2015   LDLDIRECT 180.4 05/14/2013   LDLCALC 73 02/16/2015   ALT 26 02/16/2015   AST 21 02/16/2015   NA 139 02/16/2015   K 4.4 02/16/2015   CL 102 02/16/2015   CREATININE 0.99 02/16/2015   BUN 12 02/16/2015   CO2 28 02/16/2015   TSH 1.95 02/16/2015   PSA 0.66 05/14/2013   HGBA1C 5.7 10/07/2011    Dg  Chest 2 View  01/19/2015   CLINICAL DATA:  Four week history of cough chills dizziness and fatigue, history of bronchitis ; history of ischemic heart disease  EXAM: CHEST  2 VIEW  COMPARISON:  PA and lateral chest of August 11, 2013  FINDINGS: The lungs are adequately inflated. There is an azygos lobe anatomy. The heart is top-normal in size but stable. The pulmonary vascularity is normal. There is no pleural effusion. There is calcification of the anterior longitudinal ligament of the thoracic spine.  IMPRESSION: There is no active cardiopulmonary disease.   Electronically Signed   By: David  SwazilandJordan M.D.   On: 01/19/2015 10:43    Assessment & Plan:   Loraine LericheMark was seen today for hypertension and hyperlipidemia.  Diagnoses and all orders for this visit:  Essential hypertension, benign- his BP is well controlled, lytes and renal function are stable Orders: -     CBC with Differential/Platelet; Future  Hyperlipidemia with target LDL less than 130- he has achieved his LDL goal and is doing well on the statin Orders: -     Comprehensive metabolic panel; Future -     CBC with Differential/Platelet; Future -     TSH; Future -     Lipid panel; Future   I am having Mr. Woo maintain his multivitamin with minerals, aspirin, GLUCOSAMINE-CHONDROITIN PO, Omega 3-6-9 Fatty Acids (OMEGA 3-6-9 COMPLEX PO), CRESTOR, and Azilsartan-Chlorthalidone.  No orders of the defined types were placed in this encounter.     Follow-up: Return in about 6 months (around 08/19/2015).  Sanda Lingerhomas Chaselynn Kepple, MD

## 2015-10-05 ENCOUNTER — Other Ambulatory Visit: Payer: Self-pay | Admitting: Internal Medicine

## 2015-12-19 ENCOUNTER — Other Ambulatory Visit: Payer: Self-pay | Admitting: Internal Medicine

## 2015-12-23 ENCOUNTER — Other Ambulatory Visit: Payer: Self-pay | Admitting: Internal Medicine

## 2016-01-24 ENCOUNTER — Ambulatory Visit (INDEPENDENT_AMBULATORY_CARE_PROVIDER_SITE_OTHER): Payer: Commercial Managed Care - PPO | Admitting: Internal Medicine

## 2016-01-24 ENCOUNTER — Other Ambulatory Visit (INDEPENDENT_AMBULATORY_CARE_PROVIDER_SITE_OTHER): Payer: Commercial Managed Care - PPO

## 2016-01-24 ENCOUNTER — Encounter: Payer: Self-pay | Admitting: Internal Medicine

## 2016-01-24 VITALS — BP 128/82 | HR 76 | Temp 98.2°F | Resp 16 | Ht 63.0 in | Wt 197.0 lb

## 2016-01-24 DIAGNOSIS — Z1159 Encounter for screening for other viral diseases: Secondary | ICD-10-CM | POA: Insufficient documentation

## 2016-01-24 DIAGNOSIS — H9190 Unspecified hearing loss, unspecified ear: Secondary | ICD-10-CM | POA: Insufficient documentation

## 2016-01-24 DIAGNOSIS — Z0000 Encounter for general adult medical examination without abnormal findings: Secondary | ICD-10-CM | POA: Diagnosis not present

## 2016-01-24 DIAGNOSIS — I10 Essential (primary) hypertension: Secondary | ICD-10-CM

## 2016-01-24 DIAGNOSIS — E785 Hyperlipidemia, unspecified: Secondary | ICD-10-CM | POA: Diagnosis not present

## 2016-01-24 DIAGNOSIS — R739 Hyperglycemia, unspecified: Secondary | ICD-10-CM

## 2016-01-24 LAB — URINALYSIS, ROUTINE W REFLEX MICROSCOPIC
BILIRUBIN URINE: NEGATIVE
HGB URINE DIPSTICK: NEGATIVE
Ketones, ur: NEGATIVE
LEUKOCYTES UA: NEGATIVE
NITRITE: NEGATIVE
Specific Gravity, Urine: 1.015 (ref 1.000–1.030)
Total Protein, Urine: NEGATIVE
UROBILINOGEN UA: 0.2 (ref 0.0–1.0)
Urine Glucose: NEGATIVE
pH: 7 (ref 5.0–8.0)

## 2016-01-24 LAB — COMPREHENSIVE METABOLIC PANEL
ALBUMIN: 4.3 g/dL (ref 3.5–5.2)
ALK PHOS: 83 U/L (ref 39–117)
ALT: 24 U/L (ref 0–53)
AST: 22 U/L (ref 0–37)
BILIRUBIN TOTAL: 0.5 mg/dL (ref 0.2–1.2)
BUN: 8 mg/dL (ref 6–23)
CO2: 28 mEq/L (ref 19–32)
Calcium: 9.7 mg/dL (ref 8.4–10.5)
Chloride: 103 mEq/L (ref 96–112)
Creatinine, Ser: 0.95 mg/dL (ref 0.40–1.50)
GFR: 85.8 mL/min (ref >60.00)
GLUCOSE: 90 mg/dL (ref 70–99)
Potassium: 4.5 mEq/L (ref 3.5–5.1)
Sodium: 139 mEq/L (ref 135–145)
TOTAL PROTEIN: 6.7 g/dL (ref 6.0–8.3)

## 2016-01-24 LAB — PSA: PSA: 0.91 ng/mL (ref 0.10–4.00)

## 2016-01-24 LAB — CBC WITH DIFFERENTIAL/PLATELET
BASOS ABS: 0 10*3/uL (ref 0.0–0.1)
Basophils Relative: 0.5 % (ref 0.0–3.0)
Eosinophils Absolute: 0.4 10*3/uL (ref 0.0–0.7)
Eosinophils Relative: 3.6 % (ref 0.0–5.0)
HCT: 46.4 % (ref 39.0–52.0)
HEMOGLOBIN: 15.7 g/dL (ref 13.0–17.0)
LYMPHS ABS: 2.2 10*3/uL (ref 0.7–4.0)
Lymphocytes Relative: 21.5 % (ref 12.0–46.0)
MCHC: 33.8 g/dL (ref 30.0–36.0)
MCV: 88.4 fl (ref 78.0–100.0)
MONO ABS: 0.8 10*3/uL (ref 0.1–1.0)
MONOS PCT: 8.1 % (ref 3.0–12.0)
NEUTROS PCT: 66.3 % (ref 43.0–77.0)
Neutro Abs: 6.7 10*3/uL (ref 1.4–7.7)
Platelets: 253 10*3/uL (ref 150.0–400.0)
RBC: 5.25 Mil/uL (ref 4.22–5.81)
RDW: 13.8 % (ref 11.5–15.5)
WBC: 10.1 10*3/uL (ref 4.0–10.5)

## 2016-01-24 LAB — FECAL OCCULT BLOOD, GUAIAC: Fecal Occult Blood: NEGATIVE

## 2016-01-24 LAB — POCT GLYCOSYLATED HEMOGLOBIN (HGB A1C): HEMOGLOBIN A1C: 5.7

## 2016-01-24 LAB — TSH: TSH: 2.21 u[IU]/mL (ref 0.35–4.50)

## 2016-01-24 MED ORDER — AZILSARTAN-CHLORTHALIDONE 40-12.5 MG PO TABS: 1.0000 | ORAL_TABLET | Freq: Every day | ORAL | Status: DC

## 2016-01-24 MED ORDER — ROSUVASTATIN CALCIUM 20 MG PO TABS: ORAL_TABLET | ORAL | Status: DC

## 2016-01-24 NOTE — Progress Notes (Signed)
Pre visit review using our clinic review tool, if applicable. No additional management support is needed unless otherwise documented below in the visit note. 

## 2016-01-24 NOTE — Patient Instructions (Signed)

## 2016-01-24 NOTE — Progress Notes (Signed)
Subjective:  Patient ID: Blake Anderson, male    DOB: 29-Apr-1956  Age: 60 y.o. MRN: [id number]  CC: Hypertension; Hyperlipidemia; and Annual Exam   HPI Blake Anderson presents for a CPX.  He tells me his blood pressures been well controlled on the combination of an ARB plus chlorthalidone. He has had no recent episodes of dizziness, lightheadedness, headache, blurred vision, chest pain, shortness of breath, dyspnea on exertion, palpitation, or fatigue.  For several years he has had hearing loss and tinnitus in both ears, he wants to have his hearing tested.  Past Medical History  Diagnosis Date  . Hypertension   . Hyperlipidemia   . GERD (gastroesophageal reflux disease)    No past surgical history on file.  reports that he has never smoked. He has never used smokeless tobacco. He reports that he does not drink alcohol or use illicit drugs. family history includes Alcohol abuse in his father; Arthritis in his mother. There is no history of Asthma, Cancer, COPD, Diabetes, Heart disease, Hyperlipidemia, Hypertension, Kidney disease, or Stroke. Allergies  Allergen Reactions  . Lisinopril Cough    Outpatient Prescriptions Prior to Visit  Medication Sig Dispense Refill  . aspirin 81 MG tablet Take 81 mg by mouth daily.    Marland Kitchen GLUCOSAMINE-CHONDROITIN PO Take 1,500 mg by mouth.     . Multiple Vitamins-Minerals (MULTIVITAMIN WITH MINERALS) tablet Take 1 tablet by mouth daily.    . Omega 3-6-9 Fatty Acids (OMEGA 3-6-9 COMPLEX PO) Take 1,500 mg by mouth.    . triamcinolone cream (KENALOG) 0.5 % APPLY 1 APPLICATION TOPICALLY 3 (THREE) TIMES DAILY. 30 g 0  . EDARBYCLOR 40-12.5 MG TABS TAKE 1 TABLET BY MOUTH DAILY. 90 tablet 0  . rosuvastatin (CRESTOR) 20 MG tablet TAKE 1 TABLET (20 MG TOTAL) BY MOUTH DAILY. 90 tablet 1   No facility-administered medications prior to visit.    ROS Review of Systems  Constitutional: Negative.  Negative for fever, chills, diaphoresis, appetite change and  fatigue.  HENT: Positive for hearing loss and tinnitus. Negative for congestion, ear pain, facial swelling, postnasal drip, rhinorrhea, sinus pressure, trouble swallowing and voice change.   Eyes: Negative.  Negative for visual disturbance.  Respiratory: Negative.  Negative for apnea, cough, choking, chest tightness, shortness of breath, wheezing and stridor.   Cardiovascular: Negative.  Negative for chest pain, palpitations and leg swelling.  Gastrointestinal: Negative.  Negative for nausea, abdominal pain, diarrhea, constipation, blood in stool and rectal pain.  Endocrine: Negative.   Genitourinary: Negative.  Negative for dysuria, urgency, frequency, hematuria, scrotal swelling, difficulty urinating, penile pain and testicular pain.  Musculoskeletal: Negative.  Negative for myalgias, back pain, joint swelling, arthralgias and neck pain.  Skin: Negative.  Negative for color change and rash.  Allergic/Immunologic: Negative.   Neurological: Negative.  Negative for dizziness, tremors, syncope, facial asymmetry, speech difficulty, weakness, light-headedness, numbness and headaches.  Hematological: Negative.  Negative for adenopathy. Does not bruise/bleed easily.  Psychiatric/Behavioral: Negative.     Objective:  BP 128/82 mmHg  Pulse 76  Temp(Src) 98.2 F (36.8 C) (Oral)  Resp 16  Ht  (1.6 m)  Wt 197 lb (89.359 kg)  BMI 34.91 kg/m2  SpO2 98%  BP Readings from Last 3 Encounters:  01/24/16 128/82  02/16/15 122/68  01/19/15 132/94    Wt Readings from Last 3 Encounters:  01/24/16 197 lb (89.359 kg)  02/16/15 193 lb (87.544 kg)  01/19/15 194 lb (87.998 kg)    Physical Exam  Constitutional: He is  oriented to person, place, and time. He appears well-developed and well-nourished. No distress.  HENT:  Head: Normocephalic and atraumatic.  Right Ear: Hearing, tympanic membrane, external ear and ear canal normal.  Left Ear: Hearing, tympanic membrane, external ear and ear canal  normal.  Mouth/Throat: Oropharynx is clear and moist. No oropharyngeal exudate.  Eyes: Conjunctivae are normal. Right eye exhibits no discharge. Left eye exhibits no discharge. No scleral icterus.  Neck: Normal range of motion. Neck supple. No JVD present. No tracheal deviation present. No thyromegaly present.  Cardiovascular: Normal rate, regular rhythm, normal heart sounds and intact distal pulses.  Exam reveals no gallop and no friction rub.   No murmur heard. Pulses:      Carotid pulses are 1+ on the right side, and 1+ on the left side.      Radial pulses are 1+ on the right side, and 1+ on the left side.       Femoral pulses are 1+ on the right side, and 1+ on the left side.      Popliteal pulses are 1+ on the right side, and 1+ on the left side.       Dorsalis pedis pulses are 1+ on the right side, and 1+ on the left side.       Posterior tibial pulses are 1+ on the right side, and 1+ on the left side.  EKG ----  Sinus  Rhythm  WITHIN NORMAL LIMITS   Pulmonary/Chest: Effort normal and breath sounds normal. No stridor. No respiratory distress. He has no wheezes. He has no rales. He exhibits no tenderness.  Abdominal: Soft. Bowel sounds are normal. He exhibits no distension and no mass. There is no tenderness. There is no rebound and no guarding. Hernia confirmed negative in the right inguinal area and confirmed negative in the left inguinal area.  Genitourinary: Prostate normal, testes normal and penis normal. Rectal exam shows external hemorrhoid. Rectal exam shows no internal hemorrhoid, no fissure, no mass, no tenderness and anal tone normal. Guaiac negative stool. Prostate is not enlarged and not tender. Right testis shows no mass, no swelling and no tenderness. Right testis is descended. Left testis shows no mass, no swelling and no tenderness. Left testis is descended. Circumcised. No penile erythema or penile tenderness. No discharge found.  Musculoskeletal: Normal range of motion.  He exhibits no edema or tenderness.  Lymphadenopathy:    He has no cervical adenopathy.       Right: No inguinal adenopathy present.       Left: No inguinal adenopathy present.  Neurological: He is oriented to person, place, and time.  Skin: Skin is warm and dry. No rash noted. He is not diaphoretic. No erythema. No pallor.  Vitals reviewed.   Lab Results  Component Value Date   WBC 10.1 01/24/2016   HGB 15.7 01/24/2016   HCT 46.4 01/24/2016   PLT 253.0 01/24/2016   GLUCOSE 90 01/24/2016   CHOL 140 02/16/2015   TRIG 113.0 02/16/2015   HDL 44.10 02/16/2015   LDLDIRECT 180.4 05/14/2013   LDLCALC 73 02/16/2015   ALT 24 01/24/2016   AST 22 01/24/2016   NA 139 01/24/2016   K 4.5 01/24/2016   CL 103 01/24/2016   CREATININE 0.95 01/24/2016   BUN 8 01/24/2016   CO2 28 01/24/2016   TSH 2.21 01/24/2016   PSA 0.91 01/24/2016   HGBA1C 5.7 01/24/2016    Dg Chest 2 View  01/19/2015  CLINICAL DATA:  Four week history of  cough chills dizziness and fatigue, history of bronchitis ; history of ischemic heart disease EXAM: CHEST  2 VIEW COMPARISON:  PA and lateral chest of August 11, 2013 FINDINGS: The lungs are adequately inflated. There is an azygos lobe anatomy. The heart is top-normal in size but stable. The pulmonary vascularity is normal. There is no pleural effusion. There is calcification of the anterior longitudinal ligament of the thoracic spine. IMPRESSION: There is no active cardiopulmonary disease. Electronically Signed   By: David  [place]Jordan M.D.   On: 01/19/2015 10:43    Assessment & Plan:   Loraine LericheMark was seen today for hypertension, hyperlipidemia and annual exam.  Diagnoses and all orders for this visit:  Essential hypertension, benign- His blood pressure is well-controlled, EKG is negative for left ventricular hypertrophy or any significant heart disease, his electrolytes and renal function are stable. -     Azilsartan-Chlorthalidone (EDARBYCLOR) 40-12.5 MG TABS; Take 1 tablet by  mouth daily. -     EKG 12-Lead  Routine general medical examination at a health care facility- exam completed, labs ordered and reviewed, vaccines reviewed, his colonoscopy needs to be updated but he does want to go undergo a colonoscopy so I have ordered a Cologuard test, patient education material was given. -     Lipid panel; Future -     Comprehensive metabolic panel; Future -     CBC with Differential/Platelet; Future -     TSH; Future -     Urinalysis, Routine w reflex microscopic ([facility]); Future -     PSA; Future  Hearing loss, unspecified laterality -     Ambulatory referral to Audiology  Hyperlipidemia with target LDL less than 130- he is doing well on Crestor, I will check his fasting lipid panel -     rosuvastatin (CRESTOR) 20 MG tablet; TAKE 1 TABLET (20 MG TOTAL) BY MOUTH DAILY.  Need for hepatitis C screening test -     Hepatitis C antibody; Future  Hyperglycemia- he has very mild prediabetes, improvement noted with lifestyle modifications, he will continue. -     POCT HgB A1C   I have changed Mr. Westley HummerReynolds's EDARBYCLOR to Azilsartan-Chlorthalidone. I am also having him maintain his multivitamin with minerals, aspirin, GLUCOSAMINE-CHONDROITIN PO, Omega 3-6-9 Fatty Acids (OMEGA 3-6-9 COMPLEX PO), triamcinolone cream, and rosuvastatin.  Meds ordered this encounter  Medications  . Azilsartan-Chlorthalidone (EDARBYCLOR) 40-12.5 MG TABS    Sig: Take 1 tablet by mouth daily.    Dispense:  90 tablet    Refill:  1  . rosuvastatin (CRESTOR) 20 MG tablet    Sig: TAKE 1 TABLET (20 MG TOTAL) BY MOUTH DAILY.    Dispense:  90 tablet    Refill:  2     Follow-up: Return in about 6 months (around 07/25/2016).  Sanda Lingerhomas Harveen Flesch, MD

## 2016-01-25 ENCOUNTER — Encounter: Payer: Self-pay | Admitting: Internal Medicine

## 2016-01-25 LAB — HEPATITIS C ANTIBODY: HCV AB: NEGATIVE

## 2016-03-04 LAB — COLOGUARD: COLOGUARD: NEGATIVE

## 2016-03-05 ENCOUNTER — Encounter: Payer: Self-pay | Admitting: Internal Medicine

## 2016-07-11 ENCOUNTER — Other Ambulatory Visit: Payer: Self-pay | Admitting: Internal Medicine

## 2016-07-11 DIAGNOSIS — I10 Essential (primary) hypertension: Secondary | ICD-10-CM

## 2016-10-05 ENCOUNTER — Other Ambulatory Visit: Payer: Self-pay | Admitting: Internal Medicine

## 2016-10-05 DIAGNOSIS — I10 Essential (primary) hypertension: Secondary | ICD-10-CM

## 2016-10-05 DIAGNOSIS — E785 Hyperlipidemia, unspecified: Secondary | ICD-10-CM

## 2016-12-26 ENCOUNTER — Other Ambulatory Visit: Payer: Self-pay | Admitting: Internal Medicine

## 2016-12-26 DIAGNOSIS — I10 Essential (primary) hypertension: Secondary | ICD-10-CM

## 2016-12-29 ENCOUNTER — Other Ambulatory Visit: Payer: Self-pay | Admitting: Internal Medicine

## 2016-12-29 DIAGNOSIS — I10 Essential (primary) hypertension: Secondary | ICD-10-CM

## 2017-01-25 DIAGNOSIS — Z23 Encounter for immunization: Secondary | ICD-10-CM | POA: Diagnosis not present

## 2017-02-11 ENCOUNTER — Ambulatory Visit (HOSPITAL_COMMUNITY)
Admission: RE | Admit: 2017-02-11 | Discharge: 2017-02-11 | Disposition: A | Discharge status: Home / Self Care | Payer: Commercial Managed Care - PPO | Source: Ambulatory Visit | Attending: Internal Medicine | Admitting: Internal Medicine

## 2017-02-11 ENCOUNTER — Ambulatory Visit (INDEPENDENT_AMBULATORY_CARE_PROVIDER_SITE_OTHER): Payer: Commercial Managed Care - PPO | Admitting: Internal Medicine

## 2017-02-11 ENCOUNTER — Encounter: Payer: Self-pay | Admitting: Internal Medicine

## 2017-02-11 ENCOUNTER — Other Ambulatory Visit (INDEPENDENT_AMBULATORY_CARE_PROVIDER_SITE_OTHER): Payer: Commercial Managed Care - PPO

## 2017-02-11 VITALS — BP 110/70 | HR 87 | Temp 98.3°F | Resp 16 | Ht 63.0 in | Wt 202.0 lb

## 2017-02-11 DIAGNOSIS — R519 Headache, unspecified: Secondary | ICD-10-CM

## 2017-02-11 DIAGNOSIS — I10 Essential (primary) hypertension: Secondary | ICD-10-CM

## 2017-02-11 DIAGNOSIS — H53001 Unspecified amblyopia, right eye: Secondary | ICD-10-CM

## 2017-02-11 DIAGNOSIS — E669 Obesity, unspecified: Secondary | ICD-10-CM

## 2017-02-11 DIAGNOSIS — R7989 Other specified abnormal findings of blood chemistry: Secondary | ICD-10-CM

## 2017-02-11 DIAGNOSIS — R270 Ataxia, unspecified: Secondary | ICD-10-CM

## 2017-02-11 DIAGNOSIS — R51 Headache: Secondary | ICD-10-CM | POA: Insufficient documentation

## 2017-02-11 DIAGNOSIS — Z0000 Encounter for general adult medical examination without abnormal findings: Secondary | ICD-10-CM

## 2017-02-11 DIAGNOSIS — R42 Dizziness and giddiness: Secondary | ICD-10-CM | POA: Diagnosis not present

## 2017-02-11 DIAGNOSIS — G43009 Migraine without aura, not intractable, without status migrainosus: Secondary | ICD-10-CM | POA: Insufficient documentation

## 2017-02-11 LAB — COMPREHENSIVE METABOLIC PANEL
ALBUMIN: 4.3 g/dL (ref 3.5–5.2)
ALT: 28 U/L (ref 0–53)
AST: 24 U/L (ref 0–37)
Alkaline Phosphatase: 65 U/L (ref 39–117)
BUN: 14 mg/dL (ref 6–23)
CHLORIDE: 100 meq/L (ref 96–112)
CO2: 31 meq/L (ref 19–32)
CREATININE: 1 mg/dL (ref 0.40–1.50)
Calcium: 9.5 mg/dL (ref 8.4–10.5)
GFR: 80.59 mL/min (ref >60.00)
Glucose, Bld: 90 mg/dL (ref 70–99)
POTASSIUM: 4.2 meq/L (ref 3.5–5.1)
SODIUM: 138 meq/L (ref 135–145)
Total Bilirubin: 0.8 mg/dL (ref 0.2–1.2)
Total Protein: 7 g/dL (ref 6.0–8.3)

## 2017-02-11 LAB — LIPID PANEL
CHOL/HDL RATIO: 3
Cholesterol: 144 mg/dL (ref 0–200)
HDL: 42.9 mg/dL (ref >39.00)
NONHDL: 100.62
Triglycerides: 235 mg/dL — ABNORMAL HIGH (ref 0.0–149.0)
VLDL: 47 mg/dL — AB (ref 0.0–40.0)

## 2017-02-11 LAB — CBC WITH DIFFERENTIAL/PLATELET
BASOS ABS: 0.1 10*3/uL (ref 0.0–0.1)
Basophils Relative: 1.1 % (ref 0.0–3.0)
EOS PCT: 2.9 % (ref 0.0–5.0)
Eosinophils Absolute: 0.3 10*3/uL (ref 0.0–0.7)
HEMATOCRIT: 48.5 % (ref 39.0–52.0)
HEMOGLOBIN: 16.6 g/dL (ref 13.0–17.0)
LYMPHS ABS: 2.2 10*3/uL (ref 0.7–4.0)
LYMPHS PCT: 22.9 % (ref 12.0–46.0)
MCHC: 34.2 g/dL (ref 30.0–36.0)
MCV: 88.5 fl (ref 78.0–100.0)
MONOS PCT: 9.4 % (ref 3.0–12.0)
Monocytes Absolute: 0.9 10*3/uL (ref 0.1–1.0)
Neutro Abs: 6 10*3/uL (ref 1.4–7.7)
Neutrophils Relative %: 63.7 % (ref 43.0–77.0)
Platelets: 262 10*3/uL (ref 150.0–400.0)
RBC: 5.48 Mil/uL (ref 4.22–5.81)
RDW: 13.2 % (ref 11.5–15.5)
WBC: 9.5 10*3/uL (ref 4.0–10.5)

## 2017-02-11 LAB — PSA: PSA: 0.74 ng/mL (ref 0.10–4.00)

## 2017-02-11 LAB — HEMOGLOBIN A1C: Hgb A1c MFr Bld: 5.7 % (ref 4.6–6.5)

## 2017-02-11 LAB — URINALYSIS, ROUTINE W REFLEX MICROSCOPIC
BILIRUBIN URINE: NEGATIVE
HGB URINE DIPSTICK: NEGATIVE
KETONES UR: NEGATIVE
LEUKOCYTES UA: NEGATIVE
NITRITE: NEGATIVE
RBC / HPF: NONE SEEN (ref 0–?)
SPECIFIC GRAVITY, URINE: 1.01 (ref 1.000–1.030)
Total Protein, Urine: NEGATIVE
URINE GLUCOSE: NEGATIVE
UROBILINOGEN UA: 0.2 (ref 0.0–1.0)
WBC UA: NONE SEEN (ref 0–?)
pH: 6.5 (ref 5.0–8.0)

## 2017-02-11 LAB — LDL CHOLESTEROL, DIRECT: LDL DIRECT: 70 mg/dL

## 2017-02-11 LAB — TSH: TSH: 5.47 u[IU]/mL — ABNORMAL HIGH (ref 0.35–4.50)

## 2017-02-11 NOTE — Patient Instructions (Signed)

## 2017-02-11 NOTE — Progress Notes (Signed)
Subjective:  Patient ID: Blake Anderson, male    DOB: February 28, 1956  Age: 61 y.o. MRN: [id number]  CC: Hypertension; Headache; and Annual Exam   HPI Blake Anderson presents for a CPX.   He complains of intermittent posterior headache, dizziness, mild ataxia, and feeling wobbly for the last few weeks. He describes the headache as a throbbing sensation in the back of his head that does not radiate into his neck or around into his face. He also tells me his eye doctor recently told him that he had a lazy eye on the right side.  Outpatient Medications Prior to Visit  Medication Sig Dispense Refill  . aspirin 81 MG tablet Take 81 mg by mouth daily.    Marland Kitchen EDARBYCLOR 40-12.5 MG TABS TAKE 1 TABLET BY MOUTH DAILY. 90 tablet 0  . GLUCOSAMINE-CHONDROITIN PO Take 1,500 mg by mouth.     . rosuvastatin (CRESTOR) 20 MG tablet TAKE 1 TABLET BY MOUTH EVERY DAY 90 tablet 2  . Azilsartan-Chlorthalidone (EDARBYCLOR) 40-12.5 MG TABS Take 1 tablet by mouth daily. appt needed for refills 30 tablet 0  . Multiple Vitamins-Minerals (MULTIVITAMIN WITH MINERALS) tablet Take 1 tablet by mouth daily.    . Omega 3-6-9 Fatty Acids (OMEGA 3-6-9 COMPLEX PO) Take 1,500 mg by mouth.    . triamcinolone cream (KENALOG) 0.5 % APPLY 1 APPLICATION TOPICALLY 3 (THREE) TIMES DAILY. 30 g 0   No facility-administered medications prior to visit.     ROS Review of Systems  Constitutional: Negative.  Negative for appetite change, diaphoresis, fatigue and unexpected weight change.  HENT: Negative.  Negative for voice change.   Eyes: Negative for photophobia, pain and visual disturbance.  Respiratory: Negative.  Negative for cough, chest tightness, shortness of breath and wheezing.   Cardiovascular: Negative.  Negative for chest pain, palpitations and leg swelling.  Gastrointestinal: Negative for abdominal pain, constipation, diarrhea, nausea and vomiting.  Endocrine: Negative.   Genitourinary: Negative.  Negative for decreased urine  volume, difficulty urinating, penile swelling, scrotal swelling and testicular pain.  Musculoskeletal: Negative.  Negative for back pain and neck pain.  Skin: Negative.   Allergic/Immunologic: Negative.   Neurological: Positive for dizziness and headaches. Negative for tremors, seizures, syncope, facial asymmetry, speech difficulty, weakness, light-headedness and numbness.  Hematological: Negative for adenopathy. Does not bruise/bleed easily.  Psychiatric/Behavioral: Negative.     Objective:  BP 110/70 (BP Location: Left Arm, Patient Position: Sitting, Cuff Size: Large)   Pulse 87   Temp 98.3 F (36.8 C) (Oral)   Resp 16   Ht 5\' 3"  (1.6 m)   Wt 202 lb (91.6 kg)   SpO2 97%   BMI 35.78 kg/m   BP Readings from Last 3 Encounters:  02/11/17 110/70  01/24/16 128/82  02/16/15 122/68    Wt Readings from Last 3 Encounters:  02/11/17 202 lb (91.6 kg)  01/24/16 197 lb (89.4 kg)  02/16/15 193 lb (87.5 kg)    Physical Exam  Constitutional: He is oriented to person, place, and time. No distress.  HENT:  Mouth/Throat: Oropharynx is clear and moist. No oropharyngeal exudate.  Eyes: Conjunctivae and EOM are normal. Pupils are equal, round, and reactive to light. Right eye exhibits no discharge. Left eye exhibits no discharge. No scleral icterus.  Neck: Normal range of motion. Neck supple. No JVD present. No tracheal deviation present. No thyromegaly present.  Cardiovascular: Normal rate, regular rhythm and intact distal pulses.  Exam reveals no gallop and no friction rub.   No murmur heard.  Pulmonary/Chest: Effort normal and breath sounds normal. No stridor. No respiratory distress. He has no wheezes. He has no rales. He exhibits no tenderness.  Abdominal: Soft. Bowel sounds are normal. He exhibits no distension and no mass. There is no tenderness. There is no rebound and no guarding.  Musculoskeletal: Normal range of motion. He exhibits no edema, tenderness or deformity.  Lymphadenopathy:     He has no cervical adenopathy.  Neurological: He is alert and oriented to person, place, and time. He displays no atrophy, no tremor and normal reflexes. No cranial nerve deficit or sensory deficit. He exhibits normal muscle tone. He displays no seizure activity. Coordination abnormal. Gait normal. He displays no Babinski's sign on the right side. He displays no Babinski's sign on the left side.  Reflex Scores:      Tricep reflexes are 1+ on the right side and 1+ on the left side.      Bicep reflexes are 1+ on the right side and 1+ on the left side.      Brachioradialis reflexes are 1+ on the right side and 1+ on the left side.      Patellar reflexes are 1+ on the right side and 1+ on the left side.      Achilles reflexes are 1+ on the right side and 1+ on the left side. He has a mildly positive Romberg test  Skin: Skin is warm and dry. No rash noted. He is not diaphoretic. No erythema. No pallor.  Psychiatric: He has a normal mood and affect. His behavior is normal. Judgment and thought content normal.  Vitals reviewed.   Lab Results  Component Value Date   WBC 9.5 02/11/2017   HGB 16.6 02/11/2017   HCT 48.5 02/11/2017   PLT 262.0 02/11/2017   GLUCOSE 90 02/11/2017   CHOL 144 02/11/2017   TRIG 235.0 (H) 02/11/2017   HDL 42.90 02/11/2017   LDLDIRECT 70.0 02/11/2017   LDLCALC 73 02/16/2015   ALT 28 02/11/2017   AST 24 02/11/2017   NA 138 02/11/2017   K 4.2 02/11/2017   CL 100 02/11/2017   CREATININE 1.00 02/11/2017   BUN 14 02/11/2017   CO2 31 02/11/2017   TSH 5.47 (H) 02/11/2017   PSA 0.74 02/11/2017   HGBA1C 5.7 02/11/2017    Dg Chest 2 View  Result Date: 01/19/2015 CLINICAL DATA:  Four week history of cough chills dizziness and fatigue, history of bronchitis ; history of ischemic heart disease EXAM: CHEST  2 VIEW COMPARISON:  PA and lateral chest of August 11, 2013 FINDINGS: The lungs are adequately inflated. There is an azygos lobe anatomy. The heart is top-normal in  size but stable. The pulmonary vascularity is normal. There is no pleural effusion. There is calcification of the anterior longitudinal ligament of the thoracic spine. IMPRESSION: There is no active cardiopulmonary disease. Electronically Signed   By: David  [place] M.D.   On: 01/19/2015 10:43    Assessment & Plan:   [name] was seen today for hypertension, headache and annual exam.  Diagnoses and all orders for this visit:  Routine general medical examination at a health care facility- Exam completed, labs ordered and reviewed, vaccines reviewed and updated, screening for colon cancer is up-to-date, patient education material was given. -     Lipid panel; Future -     Comprehensive metabolic panel; Future -     CBC with Differential/Platelet; Future -     TSH; Future -     Urinalysis, Routine  w reflex microscopic; Future -     PSA; Future  Obesity (BMI 30-39.9)- he agrees to work on his lifestyle modifications to help her lose weight. -     Hemoglobin A1c; Future  Intractable episodic headache, unspecified headache type- examination is remarkable only for mildly positive Romberg, his labs are negative for any concerns for systemic illness, his MRI is normal, most likely either tension or migraine headache and I will treat accordingly. -     MR BRAIN WO CONTRAST; Future  Ataxia- as above -     MR BRAIN WO CONTRAST; Future  Lazy eye, right- MRI is negative for any lesions that would explain this, he tells me he's been referred to a neuro-ophthalmologist. -     Ambulatory referral to Ophthalmology -     MR BRAIN WO CONTRAST; Future  Essential hypertension, benign- his BP is well controlled   I have discontinued Mr. Westley HummerReynolds's multivitamin with minerals, Omega 3-6-9 Fatty Acids (OMEGA 3-6-9 COMPLEX PO), triamcinolone cream, and Azilsartan-Chlorthalidone. I am also having him maintain his aspirin, GLUCOSAMINE-CHONDROITIN PO, rosuvastatin, and EDARBYCLOR.  No orders of the defined types  were placed in this encounter.    Follow-up: Return in about 1 week (around 02/18/2017).  Sanda Lingerhomas Celine Dishman, MD

## 2017-02-18 ENCOUNTER — Ambulatory Visit (INDEPENDENT_AMBULATORY_CARE_PROVIDER_SITE_OTHER): Payer: Commercial Managed Care - PPO | Admitting: Internal Medicine

## 2017-02-18 ENCOUNTER — Encounter: Payer: Self-pay | Admitting: Internal Medicine

## 2017-02-18 ENCOUNTER — Other Ambulatory Visit: Payer: Commercial Managed Care - PPO

## 2017-02-18 VITALS — BP 128/80 | HR 83 | Temp 98.3°F | Resp 16 | Ht 63.0 in | Wt 207.0 lb

## 2017-02-18 DIAGNOSIS — R51 Headache: Secondary | ICD-10-CM | POA: Diagnosis not present

## 2017-02-18 DIAGNOSIS — R7989 Other specified abnormal findings of blood chemistry: Secondary | ICD-10-CM | POA: Insufficient documentation

## 2017-02-18 DIAGNOSIS — R946 Abnormal results of thyroid function studies: Secondary | ICD-10-CM

## 2017-02-18 DIAGNOSIS — G43011 Migraine without aura, intractable, with status migrainosus: Secondary | ICD-10-CM

## 2017-02-18 DIAGNOSIS — H5032 Intermittent alternating esotropia: Secondary | ICD-10-CM | POA: Diagnosis not present

## 2017-02-18 DIAGNOSIS — H532 Diplopia: Secondary | ICD-10-CM | POA: Diagnosis not present

## 2017-02-18 DIAGNOSIS — H25813 Combined forms of age-related cataract, bilateral: Secondary | ICD-10-CM | POA: Diagnosis not present

## 2017-02-18 MED ORDER — ELETRIPTAN HYDROBROMIDE 20 MG PO TABS: 20.0000 mg | ORAL_TABLET | ORAL | 3 refills | Status: DC | PRN

## 2017-02-18 NOTE — Progress Notes (Signed)
Subjective:  Patient ID: Blake Anderson, male    DOB: 1955/12/17  Age: 61 y.o. MRN: [id number]  CC: Headache   HPI Blake Anderson presents for f/up - He was recently seen for headache and underwent an MRI which was unremarkable. He continues to complain of a dull pain in the posterior aspect of his head with a few episodes of photophobia and phonophobia. He is seeing an ophthalmologist later today regarding his lazy right eye. During his workup he was also found to have a mildly elevated TSH.  Outpatient Medications Prior to Visit  Medication Sig Dispense Refill  . aspirin 81 MG tablet Take 81 mg by mouth daily.    Marland Kitchen. EDARBYCLOR 40-12.5 MG TABS TAKE 1 TABLET BY MOUTH DAILY. 90 tablet 0  . GLUCOSAMINE-CHONDROITIN PO Take 1,500 mg by mouth.     . rosuvastatin (CRESTOR) 20 MG tablet TAKE 1 TABLET BY MOUTH EVERY DAY 90 tablet 2   No facility-administered medications prior to visit.     ROS Review of Systems  Constitutional: Negative.  Negative for chills, fatigue, fever and unexpected weight change.  HENT: Negative.   Eyes: Positive for photophobia. Negative for pain and visual disturbance.  Respiratory: Negative.  Negative for shortness of breath and wheezing.   Cardiovascular: Negative.  Negative for chest pain, palpitations and leg swelling.  Gastrointestinal: Negative.  Negative for abdominal pain, constipation, diarrhea, nausea and vomiting.  Endocrine: Negative.   Genitourinary: Negative.   Musculoskeletal: Negative.  Negative for back pain and myalgias.  Skin: Negative.   Allergic/Immunologic: Negative.   Neurological: Positive for headaches. Negative for dizziness, speech difficulty, weakness and numbness.  Hematological: Negative for adenopathy. Does not bruise/bleed easily.  Psychiatric/Behavioral: Negative.     Objective:  BP 128/80 (BP Location: Left Arm, Patient Position: Sitting, Cuff Size: Normal)   Pulse 83   Temp 98.3 F (36.8 C) (Oral)   Resp 16   Ht 5\' 3"   (1.6 m)   Wt 207 lb (93.9 kg)   SpO2 98%   BMI 36.67 kg/m   BP Readings from Last 3 Encounters:  02/18/17 128/80  02/11/17 110/70  01/24/16 128/82    Wt Readings from Last 3 Encounters:  02/18/17 207 lb (93.9 kg)  02/11/17 202 lb (91.6 kg)  01/24/16 197 lb (89.4 kg)    Physical Exam  Constitutional: He is oriented to person, place, and time. No distress.  HENT:  Mouth/Throat: Oropharynx is clear and moist. No oropharyngeal exudate.  Eyes: Conjunctivae are normal. Right eye exhibits no discharge. Left eye exhibits no discharge. No scleral icterus.  Neck: Normal range of motion. Neck supple. No JVD present. No thyromegaly present.  Cardiovascular: Normal rate, regular rhythm and intact distal pulses.  Exam reveals no gallop and no friction rub.   No murmur heard. Pulmonary/Chest: Effort normal and breath sounds normal. No respiratory distress. He has no wheezes. He has no rales. He exhibits no tenderness.  Abdominal: Soft. Bowel sounds are normal. He exhibits no distension and no mass. There is no tenderness. There is no rebound and no guarding.  Musculoskeletal: Normal range of motion. He exhibits no edema, tenderness or deformity.  Lymphadenopathy:    He has no cervical adenopathy.  Neurological: He is alert and oriented to person, place, and time. He has normal reflexes. He displays normal reflexes. No cranial nerve deficit. He exhibits normal muscle tone. Coordination normal.  Skin: Skin is warm and dry. No rash noted. He is not diaphoretic. No erythema. No pallor.  Vitals reviewed.   Lab Results  Component Value Date   WBC 9.5 02/11/2017   HGB 16.6 02/11/2017   HCT 48.5 02/11/2017   PLT 262.0 02/11/2017   GLUCOSE 90 02/11/2017   CHOL 144 02/11/2017   TRIG 235.0 (H) 02/11/2017   HDL 42.90 02/11/2017   LDLDIRECT 70.0 02/11/2017   LDLCALC 73 02/16/2015   ALT 28 02/11/2017   AST 24 02/11/2017   NA 138 02/11/2017   K 4.2 02/11/2017   CL 100 02/11/2017   CREATININE  1.00 02/11/2017   BUN 14 02/11/2017   CO2 31 02/11/2017   TSH 4.14 02/18/2017   PSA 0.74 02/11/2017   HGBA1C 5.7 02/11/2017    Mr Brain Wo Contrast  Result Date: 02/11/2017 CLINICAL DATA:  61 year old male with 10 days of unexplained headache and dizziness. Ataxia. EXAM: MRI HEAD WITHOUT CONTRAST TECHNIQUE: Multiplanar, multiecho pulse sequences of the brain and surrounding structures were obtained without intravenous contrast. COMPARISON:  None. FINDINGS: Brain: Cerebral volume is within normal limits for age. No restricted diffusion to suggest acute infarction. No midline shift, mass effect, evidence of mass lesion, ventriculomegaly, extra-axial collection or acute intracranial hemorrhage. Cervicomedullary junction and pituitary are within normal limits. Wallace Cullens and white matter signal is within normal limits for age throughout the brain. No cortical encephalomalacia or chronic cerebral blood products. Deep gray matter nuclei, brainstem, and cerebellum appear normal. Vascular: Major intracranial vascular flow voids are preserved. Skull and upper cervical spine: Negative. Normal bone marrow signal. Sinuses/Orbits: Disc conjugate gaze but otherwise negative orbit soft tissues. Paranasal sinuses are clear. Other: Mastoid air cells are clear. Visible internal auditory structures appear normal. Negative scalp soft tissues. IMPRESSION: No acute intracranial abnormality and normal for age noncontrast Normal MRI appearance of the brain. Electronically Signed   By: Odessa Fleming M.D.   On: 02/11/2017 17:33    Assessment & Plan:   [name] was seen today for headache.  Diagnoses and all orders for this visit:  Intractable migraine without aura and with status migrainosus- we will try to control the headache with a triptan -     eletriptan (RELPAX) 20 MG tablet; Take 1 tablet (20 mg total) by mouth as needed for migraine or headache. May repeat in 2 hours if headache persists or recurs.  TSH elevation- his TFTs are  normal now, screening for autoimmune thyroid disease is negative. Will continue to observe this over time. -     Thyroid peroxidase antibody; Future -     Thyroid Panel With TSH; Future   I am having Blake Anderson start on eletriptan. I am also having him maintain his aspirin, GLUCOSAMINE-CHONDROITIN PO, rosuvastatin, and EDARBYCLOR.  Meds ordered this encounter  Medications  . eletriptan (RELPAX) 20 MG tablet    Sig: Take 1 tablet (20 mg total) by mouth as needed for migraine or headache. May repeat in 2 hours if headache persists or recurs.    Dispense:  10 tablet    Refill:  3     Follow-up: Return in about 6 weeks (around 04/01/2017).  Sanda Linger, MD

## 2017-02-18 NOTE — Patient Instructions (Signed)
Migraine Headache A migraine headache is an intense, throbbing pain on one side or both sides of the head. Migraines may also cause other symptoms, such as nausea, vomiting, and sensitivity to light and noise. What are the causes? Doing or taking certain things may also trigger migraines, such as:  Alcohol.  Smoking.  Medicines, such as: ? Medicine used to treat chest pain (nitroglycerine). ? Birth control pills. ? Estrogen pills. ? Certain blood pressure medicines.  Aged cheeses, chocolate, or caffeine.  Foods or drinks that contain nitrates, glutamate, aspartame, or tyramine.  Physical activity.  Other things that may trigger a migraine include:  Menstruation.  Pregnancy.  Hunger.  Stress, lack of sleep, too much sleep, or fatigue.  Weather changes.  What increases the risk? The following factors may make you more likely to experience migraine headaches:  Age. Risk increases with age.  Family history of migraine headaches.  Being Caucasian.  Depression and anxiety.  Obesity.  Being a woman.  Having a hole in the heart (patent foramen ovale) or other heart problems.  What are the signs or symptoms? The main symptom of this condition is pulsating or throbbing pain. Pain may:  Happen in any area of the head, such as on one side or both sides.  Interfere with daily activities.  Get worse with physical activity.  Get worse with exposure to bright lights or loud noises.  Other symptoms may include:  Nausea.  Vomiting.  Dizziness.  General sensitivity to bright lights, loud noises, or smells.  Before you get a migraine, you may get warning signs that a migraine is developing (aura). An aura may include:  Seeing flashing lights or having blind spots.  Seeing bright spots, halos, or zigzag lines.  Having tunnel vision or blurred vision.  Having numbness or a tingling feeling.  Having trouble talking.  Having muscle weakness.  How is this  diagnosed? A migraine headache can be diagnosed based on:  Your symptoms.  A physical exam.  Tests, such as CT scan or MRI of the head. These imaging tests can help rule out other causes of headaches.  Taking fluid from the spine (lumbar puncture) and analyzing it (cerebrospinal fluid analysis, or CSF analysis).  How is this treated? A migraine headache is usually treated with medicines that:  Relieve pain.  Relieve nausea.  Prevent migraines from coming back.  Treatment may also include:  Acupuncture.  Lifestyle changes like avoiding foods that trigger migraines.  Follow these instructions at home: Medicines  Take over-the-counter and prescription medicines only as told by your health care provider.  Do not drive or use heavy machinery while taking prescription pain medicine.  To prevent or treat constipation while you are taking prescription pain medicine, your health care provider may recommend that you: ? Drink enough fluid to keep your urine clear or pale yellow. ? Take over-the-counter or prescription medicines. ? Eat foods that are high in fiber, such as fresh fruits and vegetables, whole grains, and beans. ? Limit foods that are high in fat and processed sugars, such as fried and sweet foods. Lifestyle  Avoid alcohol use.  Do not use any products that contain nicotine or tobacco, such as cigarettes and e-cigarettes. If you need help quitting, ask your health care provider.  Get at least 8 hours of sleep every night.  Limit your stress. General instructions   Keep a journal to find out what may trigger your migraine headaches. For example, write down: ? What you eat and   drink. ? How much sleep you get. ? Any change to your diet or medicines.  If you have a migraine: ? Avoid things that make your symptoms worse, such as bright lights. ? It may help to lie down in a dark, quiet room. ? Do not drive or use heavy machinery. ? Ask your health care provider  what activities are safe for you while you are experiencing symptoms.  Keep all follow-up visits as told by your health care provider. This is important. Contact a health care provider if:  You develop symptoms that are different or more severe than your usual migraine symptoms. Get help right away if:  Your migraine becomes severe.  You have a fever.  You have a stiff neck.  You have vision loss.  Your muscles feel weak or like you cannot control them.  You start to lose your balance often.  You develop trouble walking.  You faint. This information is not intended to replace advice given to you by your health care provider. Make sure you discuss any questions you have with your health care provider. Document Released: 07/29/2005 Document Revised: 02/16/2016 Document Reviewed: 01/15/2016 [employer] Interactive Patient Education  2017 [employer]   

## 2017-02-19 LAB — THYROID PEROXIDASE ANTIBODY

## 2017-02-19 LAB — THYROID PANEL WITH TSH
Free Thyroxine Index: 2.5 (ref 1.4–3.8)
T3 Uptake: 27 % (ref 22–35)
T4 TOTAL: 9.2 ug/dL (ref 4.5–12.0)
TSH: 4.14 m[IU]/L (ref 0.40–4.50)

## 2017-03-04 ENCOUNTER — Ambulatory Visit (INDEPENDENT_AMBULATORY_CARE_PROVIDER_SITE_OTHER): Payer: Commercial Managed Care - PPO | Admitting: Neurology

## 2017-03-04 ENCOUNTER — Encounter: Payer: Self-pay | Admitting: Neurology

## 2017-03-04 VITALS — BP 130/93 | HR 84 | Ht 63.0 in | Wt 208.0 lb

## 2017-03-04 DIAGNOSIS — R519 Headache, unspecified: Secondary | ICD-10-CM

## 2017-03-04 DIAGNOSIS — H269 Unspecified cataract: Secondary | ICD-10-CM | POA: Diagnosis not present

## 2017-03-04 DIAGNOSIS — H532 Diplopia: Secondary | ICD-10-CM | POA: Diagnosis not present

## 2017-03-04 DIAGNOSIS — R51 Headache: Secondary | ICD-10-CM

## 2017-03-04 NOTE — Patient Instructions (Addendum)
Your neurological exam and your recent brain MRI are benign, which is reassuring.   Please remember, common headache triggers are: sleep deprivation, dehydration, overheating, stress, hypoglycemia or skipping meals and blood sugar fluctuations, excessive pain medications or excessive alcohol use or caffeine withdrawal. Some people have food triggers such as aged cheese, orange juice or chocolate, especially dark chocolate, or MSG (monosodium glutamate). Try to avoid these headache triggers as much possible. It may be helpful to keep a headache diary to figure out what makes your headaches worse or brings them on and what alleviates them. Some people report headache onset after exercise but studies have shown that regular exercise may actually prevent headaches from coming. If you have exercise-induced headaches, please make sure that you drink plenty of fluid before and after exercising and that you do not over do it and do not overheat.  Based on your symptoms and your exam I believe you are at risk for obstructive sleep apnea or OSA, and I think we should proceed with a sleep study to determine whether you do or do not have OSA and how severe it is. If you have more than mild OSA, I want you to consider treatment with CPAP. Please remember, the risks and ramifications of moderate to severe obstructive sleep apnea or OSA are: Cardiovascular disease, including congestive heart failure, stroke, difficult to control hypertension, arrhythmias, and even type 2 diabetes has been linked to untreated OSA. Sleep apnea causes disruption of sleep and sleep deprivation in most cases, which, in turn, can cause recurrent headaches, problems with memory, mood, concentration, focus, and vigilance. Most people with untreated sleep apnea report excessive daytime sleepiness, which can affect their ability to drive. Please do not drive if you feel sleepy.   As discussed, we will consider sleep study testing after your cataract  surgeries.   I will likely see you back after your sleep study to go over the test results and where to go from there. We will call you after your sleep study to advise about the results (most likely, you will hear from Lafonda Mossesiana, my nurse) and to set up an appointment at the time, as necessary.    Our sleep lab administrative assistant, Alvis LemmingsDawn will meet with you or call you to schedule your sleep study. If you don't hear back from her by next week please feel free to call her at [phone]. This is her direct line and please leave a message with your phone number to call back if you get the voicemail box. She will call back as soon as possible.

## 2017-03-04 NOTE — Progress Notes (Signed)
Subjective:    Patient ID: Blake Anderson is a 61 y.o. male.  HPI     [name], MD, PhD [facility] [address] [address] [place], [place] [address]  Dear Dr. Dione Booze,   I saw your patient, Blake Anderson, upon your kind request in my neurologic clinic today for initial consultation of his recurrent headaches and visual disturbance. The patient is unaccompanied today. As you know, [name] is a 61 year old right-handed gentleman with an underlying medical history of cataracts, hypertension, hyperlipidemia, reflux disease and obesity, who reports recent recurrent headaches. He has had some associated light sensitivity and blurry vision. He also had intermittent double vision. I reviewed your office note from 02/18/2017. He had a fairly benign eye exam. He recently saw his primary care physician on 02/18/2017 and was started on Relpax as needed. He has not tried it yet.  He had a brain MRI without contrast on 02/11/2017 which I reviewed: IMPRESSION: No acute intracranial abnormality and normal for age noncontrast Normal MRI appearance of the brain. In addition, I personally reviewed the images through the PACS system and also shared some images on the computer with patient.  He reports a bilateral headache which typically starts at the base of the skull and sometimes radiates forward bilaterally to the top of the head or even frontally. He is not sure if it has a throbbing character. Headache can be severe. He started having recurrent headaches for the past 6 months, worse over the past month. He attributed the headache to developing worsening cataracts bilaterally. He is scheduled for cataract surgery on 03/17/2017 on the right and 03/31/2017 on the left. He started developing cataracts about 2 years ago and was under observation for this. He does not have a personal history of migraines were a family history of migraines. He does not have nausea or vomiting  with it, no associated neurological symptoms such as one-sided weakness, numbness, tingling, droopy face or slurring of speech. He does have photophobia but this appears to be independent of his headaches. He has taken occasional Advil which helps. He is a nonsmoker and rarely drinks alcohol, has eliminated sodas and coffee. He does not like coffee but does drink sweet tea about 2 glasses per day. He does not sleep very well and has been taking Advil PM for the past year or so. He has occasional knee discomfort but does not require to take Advil for this on a day-to-day basis. He has gained weight. He does have a sedentary job and also has to travel for his work. He is a Psychologist, educational for [employer]. He is single, lives alone, has no children. He has 3 older brothers and 2 younger half sisters. He is not sure if he snores. He has woken up with a headache at times. He has nocturia about once per average night, he has trouble going to sleep and staying asleep. He has been a night owl for a long time since teenage years or early adulthood. Bedtime is post midnight typically and wake up time around 8:30 or later. He works from home at the computer a lot. He would be willing to consider a sleep study but would like to wait until after he has had his cataract surgeries. Epworth sleepiness score is 10 out of 24, fatigue score is 31 out of 63. He has had double vision for the past several months which is binocular. He has been wearing contact lenses for years and switched to  wearing glasses a year ago. His prescription for his glasses changed last year.    His Past Medical History Is Significant For: Past Medical History:  Diagnosis Date  . GERD (gastroesophageal reflux disease)   . Hyperlipidemia   . Hypertension     His Past Surgical History Is Significant For: No past surgical history on file.  His Family History Is Significant For: Family History  Problem Relation Age of Onset  . Arthritis Mother   . Alcohol  abuse Father   . Asthma Neg Hx   . Cancer Neg Hx   . COPD Neg Hx   . Diabetes Neg Hx   . Heart disease Neg Hx   . Hyperlipidemia Neg Hx   . Hypertension Neg Hx   . Kidney disease Neg Hx   . Stroke Neg Hx     His Social History Is Significant For: Social History   Social History  . Marital status: Single    Spouse name: N/A  . Number of children: N/A  . Years of education: N/A   Social History Main Topics  . Smoking status: Never Smoker  . Smokeless tobacco: Never Used  . Alcohol use No  . Drug use: No  . Sexual activity: Not Currently   Other Topics Concern  . None   Social History Narrative  . None    His Allergies Are:  Allergies  Allergen Reactions  . Lisinopril Cough  :   His Current Medications Are:  Outpatient Encounter Prescriptions as of 03/04/2017  Medication Sig  . aspirin 81 MG tablet Take 81 mg by mouth daily.  Marland Kitchen. EDARBYCLOR 40-12.5 MG TABS TAKE 1 TABLET BY MOUTH DAILY.  Marland Kitchen. GLUCOSAMINE-CHONDROITIN PO Take 1,500 mg by mouth.   . Ibuprofen (ADVIL PO) Take 1-2 tablets by mouth 2 (two) times daily as needed.  . rosuvastatin (CRESTOR) 20 MG tablet TAKE 1 TABLET BY MOUTH EVERY DAY  . eletriptan (RELPAX) 20 MG tablet Take 1 tablet (20 mg total) by mouth as needed for migraine or headache. May repeat in 2 hours if headache persists or recurs. (Patient not taking: Reported on 03/04/2017)   No facility-administered encounter medications on file as of 03/04/2017.   : Review of Systems:  Out of a complete 14 point review of systems, all are reviewed and negative with the exception of these symptoms as listed below: Review of Systems  Neurological:       Pt presents today to discuss his headaches and double vision. Pt believes that his double vision is related to his cataracts and is scheduled to have cataract surgery in August. Pt is having on average 3 headaches per week that last 1-2 hours. Pt will take an advil and that usually resolves the headache. Pt has an  RX for relpax but has not tried it yet. Pt has never had a sleep study and it is unknown if he snores at night.  Epworth Sleepiness Scale 0= would never doze 1= slight chance of dozing 2= moderate chance of dozing 3= high chance of dozing  Sitting and reading: 2 Watching TV: 2 Sitting inactive in a public place (ex. Theater or meeting): 0 As a passenger in a car for an hour without a break: 1 Lying down to rest in the afternoon: 3 Sitting and talking to someone: 0 Sitting quietly after lunch (no alcohol): 2 In a car, while stopped in traffic: 0 Total: 10     Objective:  Neurological Exam  Physical Exam Physical Examination:  Vitals:   03/04/17 0911  BP: (!) 130/93  Pulse: 84    General Examination: The patient is a very pleasant 61 y.o. male in no acute distress. He appears well-developed and well-nourished and well groomed.   HEENT: Normocephalic, atraumatic, pupils are equal, round and reactive to light and accommodation. He has corrective eyeglasses. Funduscopic exam is difficult secondary to bilateral cataracts. He has mild and intermittent double vision in all directions. Extraocular tracking is good without nystagmus noted. He has normal hearing. Face is symmetric with normal facial animation and normal facial sensation. Speech is clear with no dysarthria noted. There is no hypophonia. There is no lip, neck/head, jaw or voice tremor. Neck is supple with full range of passive and active motion. There are no carotid bruits on auscultation. Oropharynx exam reveals: moderate mouth dryness, adequate dental hygiene and marked airway crowding, due Larger tongue, thicker soft palate, smaller airway, tonsils in place, tonsils were not fully visualized. Mallampati is class III. Neck circumference is 17-7/8 inches.   Chest: Clear to auscultation without wheezing, rhonchi or crackles noted.  Heart: S1+S2+0, regular and normal without murmurs, rubs or gallops noted.   Abdomen: Soft,  non-tender and non-distended with normal bowel sounds appreciated on auscultation.  Extremities: There is no pitting edema in the distal lower extremities bilaterally. Pedal pulses are intact.  Skin: Warm and dry without trophic changes noted.  Musculoskeletal: exam reveals no obvious joint deformities, tenderness or joint swelling or erythema.   Neurologically:  Mental status: The patient is awake, alert and oriented in all 4 spheres. His immediate and remote memory, attention, language skills and fund of knowledge are appropriate. There is no evidence of aphasia, agnosia, apraxia or anomia. Speech is clear with normal prosody and enunciation. Thought process is linear. Mood is normal and affect is normal.  Cranial nerves II - XII are as described above under HEENT exam. In addition: shoulder shrug is normal with equal shoulder height noted. Motor exam: Normal bulk, strength and tone is noted. There is no drift, tremor or rebound. Romberg is negative. Reflexes are 2+ throughout. Fine motor skills and coordination: intact with normal finger taps, normal hand movements, normal rapid alternating patting, normal foot taps and normal foot agility.  Cerebellar testing: No dysmetria or intention tremor on finger to nose testing. Heel to shin is unremarkable bilaterally. There is no truncal or gait ataxia.  Sensory exam: intact to light touch, pinprick, vibration, temperature sense in the upper and lower extremities.  Gait, station and balance: He stands easily. No veering to one side is noted. No leaning to one side is noted. Posture is age-appropriate and stance is narrow based. Gait shows normal stride length and normal pace. No problems turning are noted. Tandem walk is unremarkable.   Assessment and Plan:  In summary, Emannuel Vise is a very pleasant 61 y.o.-year old male with an underlying medical history of cataracts, hypertension, hyperlipidemia, reflux disease and obesity, who reports recent  recurrent headaches for the past 6 months, worse in the past 6 weeks or so. He had a recent normal brain MRI which is reassuring and also nonfocal neurological exam, his history is not suggestive of migraines. He is advised to use Advil only when he needs it for pain and not for sleep at night. He can try p.m. type medication at night such as Benadryl without additional Advil or Tylenol in it.his history and physical exam concerning for underlying obstructive sleep apnea and I suggested we proceed with sleep study  testing but we mutually agreed to wait until he has had his cataract surgeries which are scheduled for the next month. I think we can wait it out. The patient would like to avoid any additional medications at this time and I suggested that we not try any other prescription medicines at this time, I would be reluctant to suggest Relpax at this time.  We talked about maintaining a healthy lifestyle in general. I encouraged the patient to eat healthy, exercise daily and keep well hydrated, to keep a scheduled bedtime and wake time routine, to not skip any meals and eat healthy snacks in between meals and to have protein with every meal.   I advised the patient about common headache triggers: sleep deprivation, dehydration, overheating, stress, hypoglycemia or skipping meals and blood sugar fluctuations, excessive pain medications or excessive alcohol use or caffeine withdrawal. Some people have food triggers such as aged cheese, orange juice or chocolate, especially dark chocolate, or MSG (monosodium glutamate). He is to try to avoid these headache triggers as much possible. It may be helpful to keep a headache diary to figure out what makes His headaches worse or brings them on and what alleviates them. Some people report headache onset after exercise but studies have shown that regular exercise may actually prevent headaches from coming. If He has exercise-induced headaches, He is advised to drink plenty  of fluid before and after exercising and that to not overdo it and to not overheat.  As far as further diagnostic testing is concerned, I suggested the following today: we will consider sleep study testing in the near future.  As far as medications are concerned, I recommended the following at this time: no new meds, use advil or Motrin prn but avoid taking nightly for sleep. I suggested we reconvene in about 3 months, sooner as needed. I answered all his questions today and he was in agreement.  Thank you very much for allowing me to participate in the care of this nice patient. If I can be of any further assistance to you please do not hesitate to call me at [phone].  Sincerely,   [name], MD, PhD

## 2017-03-17 DIAGNOSIS — H2511 Age-related nuclear cataract, right eye: Secondary | ICD-10-CM | POA: Diagnosis not present

## 2017-03-17 DIAGNOSIS — H25811 Combined forms of age-related cataract, right eye: Secondary | ICD-10-CM | POA: Diagnosis not present

## 2017-03-27 DIAGNOSIS — H2512 Age-related nuclear cataract, left eye: Secondary | ICD-10-CM | POA: Diagnosis not present

## 2017-03-31 DIAGNOSIS — H25012 Cortical age-related cataract, left eye: Secondary | ICD-10-CM | POA: Diagnosis not present

## 2017-03-31 DIAGNOSIS — H25042 Posterior subcapsular polar age-related cataract, left eye: Secondary | ICD-10-CM | POA: Diagnosis not present

## 2017-03-31 DIAGNOSIS — H2512 Age-related nuclear cataract, left eye: Secondary | ICD-10-CM | POA: Diagnosis not present

## 2017-04-20 ENCOUNTER — Other Ambulatory Visit: Payer: Self-pay | Admitting: Internal Medicine

## 2017-04-20 DIAGNOSIS — I10 Essential (primary) hypertension: Secondary | ICD-10-CM

## 2017-05-09 DIAGNOSIS — Z23 Encounter for immunization: Secondary | ICD-10-CM | POA: Diagnosis not present

## 2017-05-13 ENCOUNTER — Telehealth: Payer: Self-pay | Admitting: Internal Medicine

## 2017-05-13 NOTE — Telephone Encounter (Signed)
Rec'd from [facility] forward 3 pages to Dr. Sanda Linger MD

## 2017-05-21 ENCOUNTER — Ambulatory Visit: Payer: Commercial Managed Care - PPO | Admitting: Neurology

## 2017-06-11 ENCOUNTER — Ambulatory Visit (INDEPENDENT_AMBULATORY_CARE_PROVIDER_SITE_OTHER): Payer: Commercial Managed Care - PPO | Admitting: Neurology

## 2017-06-11 ENCOUNTER — Encounter: Payer: Self-pay | Admitting: Neurology

## 2017-06-11 VITALS — BP 126/78 | Ht 63.0 in | Wt 218.5 lb

## 2017-06-11 DIAGNOSIS — R51 Headache: Secondary | ICD-10-CM | POA: Diagnosis not present

## 2017-06-11 DIAGNOSIS — G478 Other sleep disorders: Secondary | ICD-10-CM | POA: Diagnosis not present

## 2017-06-11 DIAGNOSIS — R519 Headache, unspecified: Secondary | ICD-10-CM

## 2017-06-11 NOTE — Patient Instructions (Addendum)
I am glad to hear you are doing better with your headaches!   Here is what we discussed today and what we came up with as our plan for you:    Based on your symptoms and your exam I believe you are at risk for obstructive sleep apnea or OSA, and I think we should proceed with a sleep study to determine whether you do or do not have OSA and how severe it is. If you have more than mild OSA, I want you to consider treatment with CPAP. Please remember, the risks and ramifications of moderate to severe obstructive sleep apnea or OSA are: Cardiovascular disease, including congestive heart failure, stroke, difficult to control hypertension, arrhythmias, and even type 2 diabetes has been linked to untreated OSA. Sleep apnea causes disruption of sleep and sleep deprivation in most cases, which, in turn, can cause recurrent headaches, problems with memory, mood, concentration, focus, and vigilance. Most people with untreated sleep apnea report excessive daytime sleepiness, which can affect their ability to drive. Please do not drive if you feel sleepy.   I will likely see you back after your sleep study to go over the test results and where to go from there. We will call you after your sleep study to advise about the results (most likely, you will hear from [place], my nurse) and to set up an appointment at the time, as necessary.    Our sleep lab administrative assistant, Alvis LemmingsDawn will call you to schedule your sleep study. If you don't hear back from her by about 2 weeks from now, please feel free to call her at [phone]. You can leave a message with your phone number and concerns, if you get the voicemail box. She will call back as soon as possible.

## 2017-06-11 NOTE — Progress Notes (Signed)
Subjective:    Patient ID: Blake Anderson is a 61 y.o. male.  HPI     Interim history:   Mr. Corvino is a 61 year old right-handed gentleman with an underlying medical history of cataracts, hypertension, hyperlipidemia, reflux disease and obesity, who presents for follow-up consultation of his recurrent headaches. The patient is unaccompanied today. I first met him on 03/04/2017 at the request of his ophthalmologist. At which time he reported recurrent bilateral headaches, intermittent double vision. Worsening cataracts, he was scheduled for cataract surgery soon and wanted to wait out how he does after surgery. Based on his history and exam also felt he was at risk for sleep apnea and I suggested sleep study testing. Again, he wanted to wait after his cataract surgeries. His primary care physician had provided him with a prescription for Relpax which he had not tried yet. He had a recent brain MRI and we discussed the findings. He had a benign neurological exam at the time.  Today, 06/11/2017: He reports doing better, HAs are better. Had cataract surgeries in August. Did well after that. He has over-the-counter readers. He has occasional milder recurrent headaches, takes Advil over-the-counter on an as-needed basis. He has a history of not sleeping well through the night and has been taking Advil PM each night. He is not sure if he snores. He has woken up with a headache in the past. He would be willing to consider coming back for a sleep study. The patient's allergies, current medications, family history, past medical history, past social history, past surgical history and problem list were reviewed and updated as appropriate.    Previously:  03/04/2017: (He) reports recent recurrent headaches. He has had some associated light sensitivity and blurry vision. He also had intermittent double vision. I reviewed your office note from 02/18/2017. He had a fairly benign eye exam. He recently saw his  primary care physician on 02/18/2017 and was started on Relpax as needed. He has not tried it yet.  He had a brain MRI without contrast on 02/11/2017 which I reviewed: IMPRESSION: No acute intracranial abnormality and normal for age noncontrast Normal MRI appearance of the brain. In addition, I personally reviewed the images through the PACS system and also shared some images on the computer with patient.   He reports a bilateral headache which typically starts at the base of the skull and sometimes radiates forward bilaterally to the top of the head or even frontally. He is not sure if it has a throbbing character. Headache can be severe. He started having recurrent headaches for the past 6 months, worse over the past month. He attributed the headache to developing worsening cataracts bilaterally. He is scheduled for cataract surgery on 03/17/2017 on the right and 03/31/2017 on the left. He started developing cataracts about 2 years ago and was under observation for this. He does not have a personal history of migraines were a family history of migraines. He does not have nausea or vomiting with it, no associated neurological symptoms such as one-sided weakness, numbness, tingling, droopy face or slurring of speech. He does have photophobia but this appears to be independent of his headaches. He has taken occasional Advil which helps. He is a nonsmoker and rarely drinks alcohol, has eliminated sodas and coffee. He does not like coffee but does drink sweet tea about 2 glasses per day. He does not sleep very well and has been taking Advil PM for the past year or so. He has occasional knee discomfort but  does not require to take Advil for this on a day-to-day basis. He has gained weight. He does have a sedentary job and also has to travel for his work. He is a Clinical research associate for [employer]. He is single, lives alone, has no children. He has 3 older brothers and 2 younger half sisters. He is not sure if he snores. He has  woken up with a headache at times. He has nocturia about once per average night, he has trouble going to sleep and staying asleep. He has been a night owl for a long time since teenage years or early adulthood. Bedtime is post midnight typically and wake up time around 8:30 or later. He works from home at the computer a lot. He would be willing to consider a sleep study but would like to wait until after he has had his cataract surgeries. Epworth sleepiness score is 10 out of 24, fatigue score is 31 out of 63. He has had double vision for the past several months which is binocular. He has been wearing contact lenses for years and switched to wearing glasses a year ago. His prescription for his glasses changed last year.    His Past Medical History Is Significant For: Past Medical History:  Diagnosis Date  . GERD (gastroesophageal reflux disease)   . Hyperlipidemia   . Hypertension     His Past Surgical History Is Significant For: No past surgical history on file.  His Family History Is Significant For: Family History  Problem Relation Age of Onset  . Arthritis Mother   . Alcohol abuse Father   . Asthma Neg Hx   . Cancer Neg Hx   . COPD Neg Hx   . Diabetes Neg Hx   . Heart disease Neg Hx   . Hyperlipidemia Neg Hx   . Hypertension Neg Hx   . Kidney disease Neg Hx   . Stroke Neg Hx     His Social History Is Significant For: Social History   Social History  . Marital status: Single    Spouse name: N/A  . Number of children: N/A  . Years of education: N/A   Social History Main Topics  . Smoking status: Never Smoker  . Smokeless tobacco: Never Used  . Alcohol use No  . Drug use: No  . Sexual activity: Not Currently   Other Topics Concern  . None   Social History Narrative  . None    His Allergies Are:  Allergies  Allergen Reactions  . Lisinopril Cough  :   His Current Medications Are:  Outpatient Encounter Prescriptions as of 06/11/2017  Medication Sig  .  aspirin 81 MG tablet Take 81 mg by mouth daily.  Marland Kitchen EDARBYCLOR 40-12.5 MG TABS TAKE 1 TABLET BY MOUTH EVERY DAY  . GLUCOSAMINE-CHONDROITIN PO Take 1,500 mg by mouth.   . Ibuprofen (ADVIL PO) Take 1-2 tablets by mouth 2 (two) times daily as needed.  . rosuvastatin (CRESTOR) 20 MG tablet TAKE 1 TABLET BY MOUTH EVERY DAY  . [DISCONTINUED] eletriptan (RELPAX) 20 MG tablet Take 1 tablet (20 mg total) by mouth as needed for migraine or headache. May repeat in 2 hours if headache persists or recurs.   No facility-administered encounter medications on file as of 06/11/2017.   :  Review of Systems:  Out of a complete 14 point review of systems, all are reviewed and negative with the exception of these symptoms as listed below:  Review of Systems  Neurological:  Patient states that since having the cataract surgery in August, his headaches have gone away. Patient is doing much better.     Objective:  Neurological Exam  Physical Exam Physical Examination:   Vitals:   06/11/17 1127  BP: 126/78    General Examination: The patient is a very pleasant 61 y.o. male in no acute distress. He appears well-developed and well-nourished and well groomed.   HEENT: Normocephalic, atraumatic, pupils are equal, round and reactive to light and accommodation. s/p b/l cataract repairs. Extraocular tracking is good without nystagmus noted. He has normal hearing. Face is symmetric with normal facial animation and normal facial sensation. Speech is clear with no dysarthria noted. There is no hypophonia. There is no lip, neck/head, jaw or voice tremor. Neck is supple with full range of passive and active motion. There are no carotid bruits on auscultation. Oropharynx exam reveals: moderate mouth dryness, adequate dental hygiene and moderate airway crowding, due larger tongue, thicker soft palate, smaller airway, tonsils in place. Mallampati is class III. Neck circumference is 18 inches.    Chest: Clear to  auscultation without wheezing, rhonchi or crackles noted.  Heart: S1+S2+0, regular and normal without murmurs, rubs or gallops noted.   Abdomen: Soft, non-tender and non-distended with normal bowel sounds appreciated on auscultation.  Extremities: There is no obvious change.  Skin: Warm and dry without trophic changes noted.  Musculoskeletal: exam reveals no obvious joint deformities, tenderness or joint swelling or erythema.   Neurologically:  Mental status: The patient is awake, alert and oriented in all 4 spheres. His immediate and remote memory, attention, language skills and fund of knowledge are appropriate. There is no evidence of aphasia, agnosia, apraxia or anomia. Speech is clear with normal prosody and enunciation. Thought process is linear. Mood is normal and affect is normal.  Cranial nerves II - XII are as described above under HEENT exam.  Motor exam: Normal bulk, strength and tone is noted. There is no drift, tremor or rebound. Romberg is negative. Reflexes are 1-2+ throughout. Fine motor skills and coordination: grossly intact.  Cerebellar testing: No dysmetria or intention tremor. There is no truncal or gait ataxia.  Sensory exam: intact to light touch.   Gait, station and balance: He stands easily. No veering to one side is noted. No leaning to one side is noted. Posture is age-appropriate and stance is narrow based. Gait shows normal stride length and normal pace.    Assessment and Plan:  In summary, Fowler Antos is a very pleasant 61 year old male with an underlying medical history of cataracts, hypertension, hyperlipidemia, reflux disease and obesity, who presents for reevaluation of his recurrent headaches. He has in the interim undergone cataract surgeries bilaterally in August 2018 and feels that his headaches are much improved. Nevertheless, he still has some milder recurrent headaches. History and particularly his physical exam are concerning for underlying  obstructive sleep apnea. He is at risk for this and I would like for him to come back for sleep study. He is willing to consider this. I will see him back after sleep study testing is completed. He would be willing to try CPAP therapy should he have obstructive sleep apnea. As far as medications are concerned, I recommended the following at this time: no new meds, use advil or Motrin prn but avoid taking nightly for sleep. He did not end up trying the Relpax he was given by his PCP. Would like to avoid any new medications. I asked him to call us  if he had any interim questions or concerns, otherwise I will likely see him back after sleep study testing is completed. I answered all his questions today and he was in agreement.  I spent 20 minutes in total face-to-face time with the patient, more than 50% of which was spent in counseling and coordination of care, reviewing test results, reviewing medication and discussing or reviewing the diagnosis of recurrent HAs, the prognosis and treatment options. Pertinent laboratory and imaging test results that were available during this visit with the patient were reviewed by me and considered in my medical decision making (see chart for details).

## 2017-06-23 ENCOUNTER — Other Ambulatory Visit: Payer: Self-pay | Admitting: Internal Medicine

## 2017-06-23 DIAGNOSIS — E785 Hyperlipidemia, unspecified: Secondary | ICD-10-CM

## 2017-06-25 ENCOUNTER — Telehealth: Payer: Self-pay | Admitting: Neurology

## 2017-06-25 NOTE — Telephone Encounter (Signed)
We have attempted to call the patient 2 times to schedule sleep study. Patient has been unavailable at the phone numbers we have on file and has not returned our calls. At this point we will send a letter asking pt to please contact the sleep lab to schedule their sleep study. If patient calls back we will schedule them for their sleep study. ° °

## 2017-07-24 ENCOUNTER — Other Ambulatory Visit: Payer: Self-pay | Admitting: Internal Medicine

## 2017-07-24 DIAGNOSIS — I10 Essential (primary) hypertension: Secondary | ICD-10-CM

## 2017-07-28 DIAGNOSIS — Z23 Encounter for immunization: Secondary | ICD-10-CM | POA: Diagnosis not present

## 2017-12-16 ENCOUNTER — Other Ambulatory Visit: Payer: Self-pay | Admitting: Internal Medicine

## 2017-12-16 DIAGNOSIS — E785 Hyperlipidemia, unspecified: Secondary | ICD-10-CM

## 2018-01-17 ENCOUNTER — Other Ambulatory Visit: Payer: Self-pay | Admitting: Internal Medicine

## 2018-01-17 DIAGNOSIS — I10 Essential (primary) hypertension: Secondary | ICD-10-CM

## 2018-03-11 ENCOUNTER — Other Ambulatory Visit: Payer: Self-pay | Admitting: Internal Medicine

## 2018-03-11 DIAGNOSIS — E785 Hyperlipidemia, unspecified: Secondary | ICD-10-CM

## 2018-04-07 ENCOUNTER — Other Ambulatory Visit: Payer: Self-pay | Admitting: Internal Medicine

## 2018-04-07 DIAGNOSIS — E785 Hyperlipidemia, unspecified: Secondary | ICD-10-CM

## 2018-04-14 ENCOUNTER — Other Ambulatory Visit (INDEPENDENT_AMBULATORY_CARE_PROVIDER_SITE_OTHER): Payer: Commercial Managed Care - PPO

## 2018-04-14 ENCOUNTER — Encounter: Payer: Self-pay | Admitting: Internal Medicine

## 2018-04-14 ENCOUNTER — Ambulatory Visit: Payer: Commercial Managed Care - PPO | Admitting: Internal Medicine

## 2018-04-14 VITALS — BP 118/78 | HR 72 | Temp 98.1°F | Resp 16 | Wt 201.0 lb

## 2018-04-14 DIAGNOSIS — E785 Hyperlipidemia, unspecified: Secondary | ICD-10-CM | POA: Diagnosis not present

## 2018-04-14 DIAGNOSIS — I10 Essential (primary) hypertension: Secondary | ICD-10-CM

## 2018-04-14 DIAGNOSIS — R7989 Other specified abnormal findings of blood chemistry: Secondary | ICD-10-CM

## 2018-04-14 DIAGNOSIS — Z23 Encounter for immunization: Secondary | ICD-10-CM | POA: Diagnosis not present

## 2018-04-14 DIAGNOSIS — L219 Seborrheic dermatitis, unspecified: Secondary | ICD-10-CM | POA: Diagnosis not present

## 2018-04-14 DIAGNOSIS — M65311 Trigger thumb, right thumb: Secondary | ICD-10-CM

## 2018-04-14 DIAGNOSIS — E876 Hypokalemia: Secondary | ICD-10-CM

## 2018-04-14 DIAGNOSIS — T502X5A Adverse effect of carbonic-anhydrase inhibitors, benzothiadiazides and other diuretics, initial encounter: Secondary | ICD-10-CM | POA: Insufficient documentation

## 2018-04-14 LAB — LIPID PANEL
CHOLESTEROL: 114 mg/dL (ref 0–200)
HDL: 42.8 mg/dL (ref >39.00)
LDL Cholesterol: 54 mg/dL (ref 0–99)
NonHDL: 71.29
TRIGLYCERIDES: 88 mg/dL (ref 0.0–149.0)
Total CHOL/HDL Ratio: 3
VLDL: 17.6 mg/dL (ref 0.0–40.0)

## 2018-04-14 LAB — CBC WITH DIFFERENTIAL/PLATELET
BASOS PCT: 1.2 % (ref 0.0–3.0)
Basophils Absolute: 0.1 10*3/uL (ref 0.0–0.1)
EOS ABS: 0.2 10*3/uL (ref 0.0–0.7)
EOS PCT: 2.2 % (ref 0.0–5.0)
HCT: 43.1 % (ref 39.0–52.0)
Hemoglobin: 15.1 g/dL (ref 13.0–17.0)
Lymphocytes Relative: 21.3 % (ref 12.0–46.0)
Lymphs Abs: 1.8 10*3/uL (ref 0.7–4.0)
MCHC: 35.1 g/dL (ref 30.0–36.0)
MCV: 86.6 fl (ref 78.0–100.0)
MONO ABS: 0.8 10*3/uL (ref 0.1–1.0)
Monocytes Relative: 9.2 % (ref 3.0–12.0)
NEUTROS ABS: 5.7 10*3/uL (ref 1.4–7.7)
Neutrophils Relative %: 66.1 % (ref 43.0–77.0)
PLATELETS: 256 10*3/uL (ref 150.0–400.0)
RBC: 4.98 Mil/uL (ref 4.22–5.81)
RDW: 13.4 % (ref 11.5–15.5)
WBC: 8.6 10*3/uL (ref 4.0–10.5)

## 2018-04-14 LAB — BASIC METABOLIC PANEL
BUN: 11 mg/dL (ref 6–23)
CO2: 22 meq/L (ref 19–32)
CREATININE: 0.89 mg/dL (ref 0.40–1.50)
Calcium: 9.1 mg/dL (ref 8.4–10.5)
Chloride: 103 mEq/L (ref 96–112)
GFR: 91.84 mL/min (ref >60.00)
GLUCOSE: 86 mg/dL (ref 70–99)
Potassium: 3.4 mEq/L — ABNORMAL LOW (ref 3.5–5.1)
Sodium: 138 mEq/L (ref 135–145)

## 2018-04-14 MED ORDER — POTASSIUM CHLORIDE CRYS ER 20 MEQ PO TBCR: 20.0000 meq | EXTENDED_RELEASE_TABLET | Freq: Two times a day (BID) | ORAL | 0 refills | Status: DC

## 2018-04-14 MED ORDER — CICLOPIROX OLAMINE 0.77 % EX SUSP: 1.0000 | Freq: Two times a day (BID) | CUTANEOUS | 1 refills | Status: DC

## 2018-04-14 MED ORDER — AZILSARTAN-CHLORTHALIDONE 40-12.5 MG PO TABS: 1.0000 | ORAL_TABLET | Freq: Every day | ORAL | 0 refills | Status: DC

## 2018-04-14 MED ORDER — ROSUVASTATIN CALCIUM 20 MG PO TABS: 20.0000 mg | ORAL_TABLET | Freq: Every day | ORAL | 0 refills | Status: DC

## 2018-04-14 NOTE — Progress Notes (Signed)
Subjective:  Patient ID: Blake Anderson, male    DOB: 1956/08/08  Age: 62 y.o. MRN: [id number]  CC: Rash; Hypertension; and Hyperlipidemia   HPI Eriberto Felch presents for f/up -  1.  He complains of pain in the right thumb and IP joint and is developed what sounds like a trigger mechanism. 2.  He has a history of seborrheic dermatitis and for the last few months has had a rash on his face.  He has been treating it with over-the-counter doses of hydrocortisone but now complains of red spots on the lateral aspect of both malar surfaces that are concerning from a cosmetic standpoint but did not cause any symptoms. 3.  He tells me his blood pressure has been well controlled.  He denies any recent episodes of headache, blurred vision, chest pain, shortness of breath, or edema.  Outpatient Medications Prior to Visit  Medication Sig Dispense Refill  . aspirin 81 MG tablet Take 81 mg by mouth daily.    Marland Kitchen GLUCOSAMINE-CHONDROITIN PO Take 1,500 mg by mouth.     . EDARBYCLOR 40-12.5 MG TABS TAKE 1 TABLET BY MOUTH EVERY DAY 90 tablet 0  . Ibuprofen (ADVIL PO) Take 1-2 tablets by mouth 2 (two) times daily as needed.    . rosuvastatin (CRESTOR) 20 MG tablet TAKE 1 TABLET BY MOUTH EVERY DAY 30 tablet 1   No facility-administered medications prior to visit.     ROS Review of Systems  Constitutional: Negative for diaphoresis, fatigue and unexpected weight change.  HENT: Negative.   Eyes: Negative for visual disturbance.  Respiratory: Negative for cough, chest tightness, shortness of breath and wheezing.   Cardiovascular: Negative for chest pain, palpitations and leg swelling.  Gastrointestinal: Negative for abdominal pain, constipation, diarrhea, nausea and vomiting.  Genitourinary: Negative.  Negative for difficulty urinating and hematuria.  Musculoskeletal: Positive for arthralgias. Negative for back pain and myalgias.  Skin: Positive for rash. Negative for color change.  Allergic/Immunologic:  Negative.   Neurological: Negative.  Negative for dizziness, weakness, light-headedness and headaches.  Hematological: Negative for adenopathy. Does not bruise/bleed easily.  Psychiatric/Behavioral: Negative.     Objective:  BP 118/78   Pulse 72   Temp 98.1 F (36.7 C) (Oral)   Resp 16   Wt 201 lb (91.2 kg)   SpO2 98%   BMI 35.61 kg/m   BP Readings from Last 3 Encounters:  04/14/18 118/78  06/11/17 126/78  03/04/17 (!) 130/93    Wt Readings from Last 3 Encounters:  04/14/18 201 lb (91.2 kg)  06/11/17 218 lb 8 oz (99.1 kg)  03/04/17 208 lb (94.3 kg)    Physical Exam  Constitutional: He is oriented to person, place, and time. No distress.  HENT:  Mouth/Throat: Oropharynx is clear and moist. No oropharyngeal exudate.  Eyes: Conjunctivae are normal. No scleral icterus.  Neck: Normal range of motion. Neck supple. No JVD present. No thyromegaly present.  Cardiovascular: Normal rate, regular rhythm and normal heart sounds. Exam reveals no gallop and no friction rub.  No murmur heard. Pulmonary/Chest: Effort normal and breath sounds normal. No respiratory distress. He has no wheezes. He has no rales.  Abdominal: Soft. Normal appearance and bowel sounds are normal. He exhibits no mass. There is no hepatosplenomegaly. There is no tenderness.  Musculoskeletal: Normal range of motion. He exhibits no edema, tenderness or deformity.  Right thumb-degenerative changes noted in the IP joint and there is clearly a trigger mechanism  Lymphadenopathy:    He has no cervical  adenopathy.  Neurological: He is alert and oriented to person, place, and time.  Skin: Skin is warm and dry. Rash noted. He is not diaphoretic. There is erythema.  In the front of both ears, the most lateral aspect of the malar surfaces on both sides, there are areas of telangiectasias and faint erythematous papules.  There are no lesions noted around the eyebrows, the nose, the lips, or the chin.  Vitals  reviewed.   Lab Results  Component Value Date   WBC 8.6 04/14/2018   HGB 15.1 04/14/2018   HCT 43.1 04/14/2018   PLT 256.0 04/14/2018   GLUCOSE 86 04/14/2018   CHOL 114 04/14/2018   TRIG 88.0 04/14/2018   HDL 42.80 04/14/2018   LDLDIRECT 70.0 02/11/2017   LDLCALC 54 04/14/2018   ALT 28 02/11/2017   AST 24 02/11/2017   NA 138 04/14/2018   K 3.4 (L) 04/14/2018   CL 103 04/14/2018   CREATININE 0.89 04/14/2018   BUN 11 04/14/2018   CO2 22 04/14/2018   TSH 4.14 02/18/2017   PSA 0.74 02/11/2017   HGBA1C 5.7 02/11/2017    Mr Brain Wo Contrast  Result Date: 02/11/2017 CLINICAL DATA:  62 year old male with 10 days of unexplained headache and dizziness. Ataxia. EXAM: MRI HEAD WITHOUT CONTRAST TECHNIQUE: Multiplanar, multiecho pulse sequences of the brain and surrounding structures were obtained without intravenous contrast. COMPARISON:  None. FINDINGS: Brain: Cerebral volume is within normal limits for age. No restricted diffusion to suggest acute infarction. No midline shift, mass effect, evidence of mass lesion, ventriculomegaly, extra-axial collection or acute intracranial hemorrhage. Cervicomedullary junction and pituitary are within normal limits. Wallace Cullens and white matter signal is within normal limits for age throughout the brain. No cortical encephalomalacia or chronic cerebral blood products. Deep gray matter nuclei, brainstem, and cerebellum appear normal. Vascular: Major intracranial vascular flow voids are preserved. Skull and upper cervical spine: Negative. Normal bone marrow signal. Sinuses/Orbits: Disc conjugate gaze but otherwise negative orbit soft tissues. Paranasal sinuses are clear. Other: Mastoid air cells are clear. Visible internal auditory structures appear normal. Negative scalp soft tissues. IMPRESSION: No acute intracranial abnormality and normal for age noncontrast Normal MRI appearance of the brain. Electronically Signed   By: Odessa Fleming M.D.   On: 02/11/2017 17:33     Assessment & Plan:   [name] was seen today for rash, hypertension and hyperlipidemia.  Diagnoses and all orders for this visit:  Need for influenza vaccination -     Flu Vaccine QUAD 6+ mos PF IM (Fluarix Quad PF)  Essential hypertension, benign- His blood pressure is adequately well controlled.  He has developed a mild hypokalemia with a potassium level of 3.4.  I have asked him to start taking a potassium supplement and to continue the current combination of an ARB and thiazide diuretic. -     CBC with Differential/Platelet; Future -     Basic metabolic panel; Future -     Azilsartan-Chlorthalidone (EDARBYCLOR) 40-12.5 MG TABS; Take 1 tablet by mouth daily. -     potassium chloride SA (K-DUR,KLOR-CON) 20 MEQ tablet; Take 1 tablet (20 mEq total) by mouth 2 (two) times daily.  Hyperlipidemia with target LDL less than 130- He has achieved his LDL goal and is doing well on the statin. -     Lipid panel; Future -     Thyroid Panel With TSH; Future -     rosuvastatin (CRESTOR) 20 MG tablet; Take 1 tablet (20 mg total) by mouth daily.  TSH  elevation- He appears euthyroid.  I will monitor his TFTs to see if he is developed hypothyroidism. -     Thyroid Panel With TSH; Future  Acute seborrheic dermatitis- Think his rash may be caused by steroid thinning.  I have asked him to stop using hydrocortisone on the face.  I have asked him to use ciclopirox for the seborrheic dermatitis. -     ciclopirox (LOPROX) 0.77 % SUSP; Apply 1 Act topically 2 (two) times daily.  Trigger thumb of right hand -     Ambulatory referral to Orthopedic Surgery  Diuretic-induced hypokalemia -     potassium chloride SA (K-DUR,KLOR-CON) 20 MEQ tablet; Take 1 tablet (20 mEq total) by mouth 2 (two) times daily.   I have discontinued Carola Rhine "Rudell"'s Ibuprofen (ADVIL PO). I have changed his EDARBYCLOR to Azilsartan-Chlorthalidone. I have also changed his rosuvastatin. Additionally, I am having him start on  potassium chloride SA and ciclopirox. Lastly, I am having him maintain his aspirin and GLUCOSAMINE-CHONDROITIN PO.  Meds ordered this encounter  Medications  . Azilsartan-Chlorthalidone (EDARBYCLOR) 40-12.5 MG TABS    Sig: Take 1 tablet by mouth daily.    Dispense:  90 tablet    Refill:  0  . rosuvastatin (CRESTOR) 20 MG tablet    Sig: Take 1 tablet (20 mg total) by mouth daily.    Dispense:  90 tablet    Refill:  0    Needs appointment for further refills.  . potassium chloride SA (K-DUR,KLOR-CON) 20 MEQ tablet    Sig: Take 1 tablet (20 mEq total) by mouth 2 (two) times daily.    Dispense:  180 tablet    Refill:  0  . ciclopirox (LOPROX) 0.77 % SUSP    Sig: Apply 1 Act topically 2 (two) times daily.    Dispense:  60 mL    Refill:  1     Follow-up: No follow-ups on file.  Sanda Linger, MD

## 2018-04-14 NOTE — Patient Instructions (Signed)

## 2018-04-15 LAB — THYROID PANEL WITH TSH
Free Thyroxine Index: 2.8 (ref 1.4–3.8)
T3 Uptake: 30 % (ref 22–35)
T4, Total: 9.4 ug/dL (ref 4.9–10.5)
TSH: 2.7 mIU/L (ref 0.40–4.50)

## 2018-04-17 NOTE — Progress Notes (Signed)
[facility] [address] [place], [place] [address] Phone: [phone] Subjective:   Blake Anderson, am serving as a scribe for Dr. Antoine Primas.  I'm seeing this patient by the request  of:  Etta Grandchild, MD   CC: Right thumb pain  [id number]  Blake Anderson is a 62 y.o. male coming in with complaint of bilateral thumb pain. He has "clicking" in the morning in the IP joints of both thumbs right greater than left. Patient had ecchymosis in the right themnar eminence. No history of triggering or other injury to the hands.  Patient states that the right hand seems to be doing the most concerning.  Symptoms can wake him up at night secondary to the popping.  Does not seem to get stuck yet.      Past Medical History:  Diagnosis Date  . GERD (gastroesophageal reflux disease)   . Hyperlipidemia   . Hypertension    History reviewed. No pertinent surgical history. Social History   Socioeconomic History  . Marital status: Single    Spouse name: Not on file  . Number of children: Not on file  . Years of education: Not on file  . Highest education level: Not on file  Occupational History  . Not on file  Social Needs  . Financial resource strain: Not on file  . Food insecurity:    Worry: Not on file    Inability: Not on file  . Transportation needs:    Medical: Not on file    Non-medical: Not on file  Tobacco Use  . Smoking status: Never Smoker  . Smokeless tobacco: Never Used  Substance and Sexual Activity  . Alcohol use: No  . Drug use: No  . Sexual activity: Not Currently  Lifestyle  . Physical activity:    Days per week: Not on file    Minutes per session: Not on file  . Stress: Not on file  Relationships  . Social connections:    Talks on phone: Not on file    Gets together: Not on file    Attends religious service: Not on file    Active member of club or organization: Not on file    Attends meetings of clubs or organizations:  Not on file    Relationship status: Not on file  Other Topics Concern  . Not on file  Social History Narrative  . Not on file   Allergies  Allergen Reactions  . Lisinopril Cough   Family History  Problem Relation Age of Onset  . Arthritis Mother   . Alcohol abuse Father   . Asthma Neg Hx   . Cancer Neg Hx   . COPD Neg Hx   . Diabetes Neg Hx   . Heart disease Neg Hx   . Hyperlipidemia Neg Hx   . Hypertension Neg Hx   . Kidney disease Neg Hx   . Stroke Neg Hx      Current Outpatient Medications (Cardiovascular):  Marland Kitchen  Azilsartan-Chlorthalidone (EDARBYCLOR) 40-12.5 MG TABS, Take 1 tablet by mouth daily. .  rosuvastatin (CRESTOR) 20 MG tablet, Take 1 tablet (20 mg total) by mouth daily.   Current Outpatient Medications (Analgesics):  .  aspirin 81 MG tablet, Take 81 mg by mouth daily.   Current Outpatient Medications (Other):  .  ciclopirox (LOPROX) 0.77 % SUSP, Apply 1 Act topically 2 (two) times daily. Marland Kitchen  GLUCOSAMINE-CHONDROITIN PO, Take 1,500 mg by mouth.  .  potassium chloride  SA (K-DUR,KLOR-CON) 20 MEQ tablet, Take 1 tablet (20 mEq total) by mouth 2 (two) times daily.    Past medical history, social, surgical and family history all reviewed in electronic medical record.  No pertanent information unless stated regarding to the chief complaint.   Review of Systems:  No headache, visual changes, nausea, vomiting, diarrhea, constipation, dizziness, abdominal pain, skin rash, fevers, chills, night sweats, weight loss, swollen lymph nodes, body aches, joint swelling, muscle aches, chest pain, shortness of breath, mood changes.   Objective  Blood pressure 104/76, pulse 86, height 5\' 3"  (1.6 m), weight 202 lb (91.6 kg), SpO2 98 %.   General: No apparent distress alert and oriented x3 mood and affect normal, dressed appropriately.  HEENT: Pupils equal, extraocular movements intact  Respiratory: Patient's speak in full sentences and does not appear short of breath    Cardiovascular: No lower extremity edema, non tender, no erythema  Skin: Warm dry intact with no signs of infection or rash on extremities or on axial skeleton.  Abdomen: Soft nontender  Neuro: Cranial nerves II through XII are intact, neurovascularly intact in all extremities with 2+ DTRs and 2+ pulses.  Lymph: No lymphadenopathy of posterior or anterior cervical chain or axillae bilaterally.  Gait normal with good balance and coordination.  MSK:  tender with full range of motion and good stability and symmetric strength and tone of shoulders, elbows, wrist, hip, knee and ankles bilaterally.  Short stature noted with mild arthritic changes Right hand exam shows the patient does have a trigger nodule at the A2 pulley of the right thumb.  Severely tender to palpation.  Procedure: Real-time Ultrasound Guided Injection of right thumb flexor tendon sheath Device: GE Logiq Q7 Ultrasound guided injection is preferred based studies that show increased duration, increased effect, greater accuracy, decreased procedural pain, increased response rate, and decreased cost with ultrasound guided versus blind injection.  Verbal informed consent obtained.  Time-out conducted.  Noted no overlying erythema, induration, or other signs of local infection.  Skin prepped in a sterile fashion.  Local anesthesia: Topical Ethyl chloride.  With sterile technique and under real time ultrasound guidance: With a 25-gauge half inch needle injected with 0.5 cc of 0.5% Marcaine and 0.5 cc of Kenalog 40 mg/mL Completed without difficulty  Pain immediately resolved suggesting accurate placement of the medication.  Advised to call if fevers/chills, erythema, induration, drainage, or persistent bleeding.  Images permanently stored and available for review in the ultrasound unit.  Impression: Technically successful ultrasound guided injection.    Impression and Recommendations:     This case required medical decision  making of moderate complexity. The above documentation has been reviewed and is accurate and complete Judi Saa, DO       Note: This dictation was prepared with Dragon dictation along with smaller phrase technology. Any transcriptional errors that result from this process are unintentional.

## 2018-04-20 ENCOUNTER — Ambulatory Visit: Payer: Commercial Managed Care - PPO | Admitting: Family Medicine

## 2018-04-20 ENCOUNTER — Ambulatory Visit: Payer: Self-pay

## 2018-04-20 ENCOUNTER — Encounter: Payer: Self-pay | Admitting: Family Medicine

## 2018-04-20 VITALS — BP 104/76 | HR 86 | Ht 63.0 in | Wt 202.0 lb

## 2018-04-20 DIAGNOSIS — M65311 Trigger thumb, right thumb: Secondary | ICD-10-CM | POA: Diagnosis not present

## 2018-04-20 DIAGNOSIS — M79644 Pain in right finger(s): Secondary | ICD-10-CM

## 2018-04-20 DIAGNOSIS — M79645 Pain in left finger(s): Secondary | ICD-10-CM | POA: Diagnosis not present

## 2018-04-20 NOTE — Assessment & Plan Note (Signed)
Patient given injection.  Tolerated the procedure well.  Discussed icing regimen, home exercise, which activities to doing which was to avoid.  Patient will do bracing at night.  Follow-up again in 4 weeks

## 2018-04-20 NOTE — Patient Instructions (Signed)
Good to see you  Ice is your friend Ice 20 minutes 2 times daily. Usually after activity and before bed. Massage it at night Wear brace at night  For next 2 weeks pennsaid pinkie amount topically 2 times daily as needed.  Have a good trip in [place]  See me again in 4 weeks if not resolved.

## 2018-04-30 DIAGNOSIS — Z961 Presence of intraocular lens: Secondary | ICD-10-CM | POA: Diagnosis not present

## 2018-04-30 DIAGNOSIS — H5051 Esophoria: Secondary | ICD-10-CM | POA: Diagnosis not present

## 2018-05-19 ENCOUNTER — Ambulatory Visit: Payer: Commercial Managed Care - PPO | Admitting: Family Medicine

## 2018-07-04 ENCOUNTER — Other Ambulatory Visit: Payer: Self-pay | Admitting: Internal Medicine

## 2018-07-04 DIAGNOSIS — T502X5A Adverse effect of carbonic-anhydrase inhibitors, benzothiadiazides and other diuretics, initial encounter: Secondary | ICD-10-CM

## 2018-07-04 DIAGNOSIS — E785 Hyperlipidemia, unspecified: Secondary | ICD-10-CM

## 2018-07-04 DIAGNOSIS — E876 Hypokalemia: Secondary | ICD-10-CM

## 2018-07-04 DIAGNOSIS — I10 Essential (primary) hypertension: Secondary | ICD-10-CM

## 2018-07-15 ENCOUNTER — Encounter: Payer: Commercial Managed Care - PPO | Admitting: Internal Medicine

## 2018-07-18 ENCOUNTER — Other Ambulatory Visit: Payer: Self-pay | Admitting: Internal Medicine

## 2018-07-18 DIAGNOSIS — I10 Essential (primary) hypertension: Secondary | ICD-10-CM

## 2018-07-28 ENCOUNTER — Encounter: Payer: Self-pay | Admitting: Internal Medicine

## 2018-07-28 ENCOUNTER — Ambulatory Visit (INDEPENDENT_AMBULATORY_CARE_PROVIDER_SITE_OTHER): Payer: Commercial Managed Care - PPO | Admitting: Internal Medicine

## 2018-07-28 ENCOUNTER — Other Ambulatory Visit (INDEPENDENT_AMBULATORY_CARE_PROVIDER_SITE_OTHER): Payer: Commercial Managed Care - PPO

## 2018-07-28 VITALS — BP 130/78 | HR 79 | Temp 98.0°F | Resp 16 | Ht 63.0 in | Wt 202.0 lb

## 2018-07-28 DIAGNOSIS — I10 Essential (primary) hypertension: Secondary | ICD-10-CM

## 2018-07-28 DIAGNOSIS — Z0000 Encounter for general adult medical examination without abnormal findings: Secondary | ICD-10-CM | POA: Diagnosis not present

## 2018-07-28 DIAGNOSIS — Z125 Encounter for screening for malignant neoplasm of prostate: Secondary | ICD-10-CM | POA: Diagnosis not present

## 2018-07-28 DIAGNOSIS — T502X5A Adverse effect of carbonic-anhydrase inhibitors, benzothiadiazides and other diuretics, initial encounter: Secondary | ICD-10-CM

## 2018-07-28 DIAGNOSIS — E876 Hypokalemia: Secondary | ICD-10-CM

## 2018-07-28 LAB — BASIC METABOLIC PANEL
BUN: 11 mg/dL (ref 6–23)
CO2: 25 mEq/L (ref 19–32)
Calcium: 9.2 mg/dL (ref 8.4–10.5)
Chloride: 102 mEq/L (ref 96–112)
Creatinine, Ser: 0.88 mg/dL (ref 0.40–1.50)
GFR: 92.96 mL/min (ref >60.00)
Glucose, Bld: 78 mg/dL (ref 70–99)
Potassium: 3.7 mEq/L (ref 3.5–5.1)
SODIUM: 137 meq/L (ref 135–145)

## 2018-07-28 LAB — MAGNESIUM: Magnesium: 1.9 mg/dL (ref 1.5–2.5)

## 2018-07-28 LAB — PSA: PSA: 0.93 ng/mL (ref 0.10–4.00)

## 2018-07-28 NOTE — Patient Instructions (Signed)

## 2018-07-28 NOTE — Progress Notes (Signed)
Subjective:  Patient ID: Blake Anderson, male    DOB: January 11, 1956  Age: 62 y.o. MRN: [id number]  CC: Annual Exam and Hypertension   HPI Blake Anderson presents for a CPX.  He feels well today and offers no complaints.  He tells me his blood pressure has been well controlled.  He is active and denies CP, DOE, palpitations, edema, or fatigue.  Outpatient Medications Prior to Visit  Medication Sig Dispense Refill  . aspirin 81 MG tablet Take 81 mg by mouth daily.    . cholecalciferol (VITAMIN D3) 25 MCG (1000 UT) tablet Take 1,000 Units by mouth daily.    . ciclopirox (LOPROX) 0.77 % SUSP Apply 1 Act topically 2 (two) times daily. 60 mL 1  . EDARBYCLOR 40-12.5 MG TABS TAKE 1 TABLET BY MOUTH EVERY DAY 30 tablet 2  . KLOR-CON M20 20 MEQ tablet TAKE 1 TABLET BY MOUTH TWICE A DAY 60 tablet 2  . rosuvastatin (CRESTOR) 20 MG tablet TAKE 1 TABLET BY MOUTH EVERY DAY 30 tablet 2  . GLUCOSAMINE-CHONDROITIN PO Take 1,500 mg by mouth.      No facility-administered medications prior to visit.     ROS Review of Systems  Constitutional: Negative for diaphoresis and fatigue.  HENT: Negative.  Negative for trouble swallowing.   Eyes: Negative for visual disturbance.  Respiratory: Negative for apnea, cough, chest tightness, shortness of breath and wheezing.   Cardiovascular: Negative for chest pain, palpitations and leg swelling.  Gastrointestinal: Negative for abdominal pain, constipation, diarrhea, nausea and vomiting.  Endocrine: Negative.   Genitourinary: Negative.  Negative for difficulty urinating, penile swelling, scrotal swelling and testicular pain.  Musculoskeletal: Negative.  Negative for arthralgias and myalgias.  Skin: Negative for color change.  Neurological: Negative.  Negative for dizziness, weakness, light-headedness and headaches.  Hematological: Negative for adenopathy. Does not bruise/bleed easily.  Psychiatric/Behavioral: Negative.     Objective:  BP 130/78 (BP Location:  Left Arm, Patient Position: Sitting, Cuff Size: Normal)   Pulse 79   Temp 98 F (36.7 C) (Oral)   Resp 16   Ht 5\' 3"  (1.6 m)   Wt 202 lb (91.6 kg)   SpO2 95%   BMI 35.78 kg/m   BP Readings from Last 3 Encounters:  07/28/18 130/78  04/20/18 104/76  04/14/18 118/78    Wt Readings from Last 3 Encounters:  07/28/18 202 lb (91.6 kg)  04/20/18 202 lb (91.6 kg)  04/14/18 201 lb (91.2 kg)    Physical Exam Vitals signs reviewed.  Constitutional:      Appearance: He is obese. He is not ill-appearing.  HENT:     Nose: Nose normal.     Mouth/Throat:     Mouth: Mucous membranes are moist.     Pharynx: No posterior oropharyngeal erythema.  Eyes:     Conjunctiva/sclera: Conjunctivae normal.  Neck:     Musculoskeletal: Normal range of motion and neck supple.  Cardiovascular:     Rate and Rhythm: Normal rate and regular rhythm.     Pulses: Normal pulses.     Heart sounds: Normal heart sounds. No murmur. No gallop.   Pulmonary:     Effort: Pulmonary effort is normal.     Breath sounds: Normal breath sounds. No stridor. No wheezing or rhonchi.  Abdominal:     General: Abdomen is flat.     Palpations: Abdomen is soft. There is no hepatomegaly or splenomegaly.     Tenderness: There is no abdominal tenderness.     Hernia:  No hernia is present. There is no hernia in the right inguinal area or left inguinal area.  Genitourinary:    Pubic Area: No rash.      Penis: Normal and circumcised. No swelling or lesions.      Scrotum/Testes: Normal.        Right: Mass, tenderness or swelling not present.        Left: Mass, tenderness or swelling not present.     Epididymis:     Right: Normal. Not inflamed or enlarged. No mass.     Left: Normal. Not inflamed or enlarged. No mass.     Prostate: Normal. Not enlarged, not tender and no nodules present.     Rectum: Normal. Guaiac result negative. No mass, tenderness, anal fissure, external hemorrhoid or internal hemorrhoid. Normal anal tone.    Musculoskeletal: Normal range of motion.        General: No swelling, tenderness or signs of injury.     Right lower leg: No edema.     Left lower leg: No edema.  Lymphadenopathy:     Cervical: No cervical adenopathy.     Lower Body: No right inguinal adenopathy. No left inguinal adenopathy.  Skin:    General: Skin is warm and dry.     Coloration: Skin is not pale.     Findings: No rash.  Neurological:     General: No focal deficit present.     Mental Status: He is alert and oriented to person, place, and time. Mental status is at baseline.     Lab Results  Component Value Date   WBC 8.6 04/14/2018   HGB 15.1 04/14/2018   HCT 43.1 04/14/2018   PLT 256.0 04/14/2018   GLUCOSE 78 07/28/2018   CHOL 114 04/14/2018   TRIG 88.0 04/14/2018   HDL 42.80 04/14/2018   LDLDIRECT 70.0 02/11/2017   LDLCALC 54 04/14/2018   ALT 28 02/11/2017   AST 24 02/11/2017   NA 137 07/28/2018   K 3.7 07/28/2018   CL 102 07/28/2018   CREATININE 0.88 07/28/2018   BUN 11 07/28/2018   CO2 25 07/28/2018   TSH 2.70 04/14/2018   PSA 0.93 07/28/2018   HGBA1C 5.7 02/11/2017    Mr Brain Wo Contrast  Result Date: 02/11/2017 CLINICAL DATA:  62 year old male with 10 days of unexplained headache and dizziness. Ataxia. EXAM: MRI HEAD WITHOUT CONTRAST TECHNIQUE: Multiplanar, multiecho pulse sequences of the brain and surrounding structures were obtained without intravenous contrast. COMPARISON:  None. FINDINGS: Brain: Cerebral volume is within normal limits for age. No restricted diffusion to suggest acute infarction. No midline shift, mass effect, evidence of mass lesion, ventriculomegaly, extra-axial collection or acute intracranial hemorrhage. Cervicomedullary junction and pituitary are within normal limits. Wallace CullensGray and white matter signal is within normal limits for age throughout the brain. No cortical encephalomalacia or chronic cerebral blood products. Deep gray matter nuclei, brainstem, and cerebellum appear  normal. Vascular: Major intracranial vascular flow voids are preserved. Skull and upper cervical spine: Negative. Normal bone marrow signal. Sinuses/Orbits: Disc conjugate gaze but otherwise negative orbit soft tissues. Paranasal sinuses are clear. Other: Mastoid air cells are clear. Visible internal auditory structures appear normal. Negative scalp soft tissues. IMPRESSION: No acute intracranial abnormality and normal for age noncontrast Normal MRI appearance of the brain. Electronically Signed   By: Odessa FlemingH  Hall M.D.   On: 02/11/2017 17:33    Assessment & Plan:   Loraine LericheMark was seen today for annual exam and hypertension.  Diagnoses and all orders  for this visit:  Essential hypertension, benign- His blood pressure is adequately well controlled.  Electrolytes and renal function are normal. -     Basic metabolic panel; Future  Diuretic-induced hypokalemia- His potassium level is in the normal range now.  Will continue the current potassium supplement. -     Basic metabolic panel; Future -     Magnesium; Future  Routine general medical examination at a health care facility- Exam completed, labs reviewed, vaccines reviewed, colon cancer screening is up-to-date, patient education material was given. -     PSA; Future -     HIV Antibody (routine testing w rflx); Future   I have discontinued Carola Rhine "Kotaro"'s GLUCOSAMINE-CHONDROITIN PO. I am also having him maintain his aspirin, ciclopirox, KLOR-CON M20, rosuvastatin, EDARBYCLOR, and cholecalciferol.  No orders of the defined types were placed in this encounter.    Follow-up: Return in about 6 months (around 01/27/2019).  Sanda Linger, MD

## 2018-07-29 LAB — HIV ANTIBODY (ROUTINE TESTING W REFLEX): HIV 1&2 Ab, 4th Generation: NONREACTIVE

## 2018-10-02 ENCOUNTER — Other Ambulatory Visit: Payer: Self-pay | Admitting: Internal Medicine

## 2018-10-02 DIAGNOSIS — E876 Hypokalemia: Secondary | ICD-10-CM

## 2018-10-02 DIAGNOSIS — I10 Essential (primary) hypertension: Secondary | ICD-10-CM

## 2018-10-02 DIAGNOSIS — T502X5A Adverse effect of carbonic-anhydrase inhibitors, benzothiadiazides and other diuretics, initial encounter: Secondary | ICD-10-CM

## 2018-10-21 ENCOUNTER — Other Ambulatory Visit: Payer: Self-pay | Admitting: Internal Medicine

## 2018-10-21 DIAGNOSIS — I10 Essential (primary) hypertension: Secondary | ICD-10-CM

## 2018-11-13 ENCOUNTER — Other Ambulatory Visit: Payer: Self-pay | Admitting: Internal Medicine

## 2018-11-13 DIAGNOSIS — E785 Hyperlipidemia, unspecified: Secondary | ICD-10-CM

## 2018-12-25 ENCOUNTER — Other Ambulatory Visit: Payer: Self-pay | Admitting: Internal Medicine

## 2018-12-25 DIAGNOSIS — T502X5A Adverse effect of carbonic-anhydrase inhibitors, benzothiadiazides and other diuretics, initial encounter: Secondary | ICD-10-CM

## 2018-12-25 DIAGNOSIS — I10 Essential (primary) hypertension: Secondary | ICD-10-CM

## 2018-12-25 DIAGNOSIS — E876 Hypokalemia: Secondary | ICD-10-CM

## 2019-01-17 ENCOUNTER — Other Ambulatory Visit: Payer: Self-pay | Admitting: Internal Medicine

## 2019-01-17 DIAGNOSIS — I10 Essential (primary) hypertension: Secondary | ICD-10-CM

## 2019-01-17 DIAGNOSIS — T502X5A Adverse effect of carbonic-anhydrase inhibitors, benzothiadiazides and other diuretics, initial encounter: Secondary | ICD-10-CM

## 2019-01-17 DIAGNOSIS — E876 Hypokalemia: Secondary | ICD-10-CM

## 2019-02-04 ENCOUNTER — Other Ambulatory Visit: Payer: Self-pay | Admitting: Internal Medicine

## 2019-02-04 DIAGNOSIS — E785 Hyperlipidemia, unspecified: Secondary | ICD-10-CM

## 2019-02-09 ENCOUNTER — Other Ambulatory Visit: Payer: Self-pay | Admitting: Internal Medicine

## 2019-02-09 DIAGNOSIS — I10 Essential (primary) hypertension: Secondary | ICD-10-CM

## 2019-02-09 DIAGNOSIS — E876 Hypokalemia: Secondary | ICD-10-CM

## 2019-04-14 ENCOUNTER — Other Ambulatory Visit: Payer: Self-pay | Admitting: Internal Medicine

## 2019-04-14 DIAGNOSIS — I10 Essential (primary) hypertension: Secondary | ICD-10-CM

## 2019-04-30 ENCOUNTER — Other Ambulatory Visit: Payer: Self-pay | Admitting: Internal Medicine

## 2019-04-30 DIAGNOSIS — E785 Hyperlipidemia, unspecified: Secondary | ICD-10-CM

## 2019-05-03 ENCOUNTER — Other Ambulatory Visit (INDEPENDENT_AMBULATORY_CARE_PROVIDER_SITE_OTHER): Payer: 59

## 2019-05-03 ENCOUNTER — Other Ambulatory Visit: Payer: Self-pay

## 2019-05-03 ENCOUNTER — Ambulatory Visit (INDEPENDENT_AMBULATORY_CARE_PROVIDER_SITE_OTHER): Payer: 59 | Admitting: Internal Medicine

## 2019-05-03 ENCOUNTER — Encounter: Payer: Self-pay | Admitting: Internal Medicine

## 2019-05-03 VITALS — BP 124/78 | HR 92 | Temp 98.1°F | Resp 16 | Ht 63.0 in | Wt 205.0 lb

## 2019-05-03 DIAGNOSIS — H60332 Swimmer's ear, left ear: Secondary | ICD-10-CM | POA: Diagnosis not present

## 2019-05-03 DIAGNOSIS — I10 Essential (primary) hypertension: Secondary | ICD-10-CM

## 2019-05-03 DIAGNOSIS — Z23 Encounter for immunization: Secondary | ICD-10-CM

## 2019-05-03 DIAGNOSIS — Z1211 Encounter for screening for malignant neoplasm of colon: Secondary | ICD-10-CM

## 2019-05-03 LAB — BASIC METABOLIC PANEL
BUN: 10 mg/dL (ref 6–23)
CO2: 28 mEq/L (ref 19–32)
Calcium: 9.7 mg/dL (ref 8.4–10.5)
Chloride: 101 mEq/L (ref 96–112)
Creatinine, Ser: 0.91 mg/dL (ref 0.40–1.50)
GFR: 83.94 mL/min (ref >60.00)
Glucose, Bld: 89 mg/dL (ref 70–99)
Potassium: 4.3 mEq/L (ref 3.5–5.1)
Sodium: 138 mEq/L (ref 135–145)

## 2019-05-03 LAB — CBC WITH DIFFERENTIAL/PLATELET
Basophils Absolute: 0.1 10*3/uL (ref 0.0–0.1)
Basophils Relative: 1.1 % (ref 0.0–3.0)
Eosinophils Absolute: 0.3 10*3/uL (ref 0.0–0.7)
Eosinophils Relative: 2.4 % (ref 0.0–5.0)
HCT: 48.5 % (ref 39.0–52.0)
Hemoglobin: 16.5 g/dL (ref 13.0–17.0)
Lymphocytes Relative: 20.5 % (ref 12.0–46.0)
Lymphs Abs: 2.1 10*3/uL (ref 0.7–4.0)
MCHC: 34.1 g/dL (ref 30.0–36.0)
MCV: 89.7 fl (ref 78.0–100.0)
Monocytes Absolute: 0.9 10*3/uL (ref 0.1–1.0)
Monocytes Relative: 8.7 % (ref 3.0–12.0)
Neutro Abs: 7 10*3/uL (ref 1.4–7.7)
Neutrophils Relative %: 67.3 % (ref 43.0–77.0)
Platelets: 267 10*3/uL (ref 150.0–400.0)
RBC: 5.41 Mil/uL (ref 4.22–5.81)
RDW: 13.7 % (ref 11.5–15.5)
WBC: 10.4 10*3/uL (ref 4.0–10.5)

## 2019-05-03 MED ORDER — CIPROFLOXACIN-HYDROCORTISONE 0.2-1 % OT SUSP: 3.0000 [drp] | Freq: Two times a day (BID) | OTIC | 0 refills | Status: AC

## 2019-05-03 NOTE — Patient Instructions (Signed)
Otitis Externa  Otitis externa is an infection of the outer ear canal. The outer ear canal is the area between the outside of the ear and the eardrum. Otitis externa is sometimes called swimmer's ear. What are the causes? Common causes of this condition include:  Swimming in dirty water.  Moisture in the ear.  An injury to the inside of the ear.  An object stuck in the ear.  A cut or scrape on the outside of the ear. What increases the risk? You are more likely to develop this condition if you go swimming often. What are the signs or symptoms? The first symptom of this condition is often itching in the ear. Later symptoms of the condition include:  Swelling of the ear.  Redness in the ear.  Ear pain. The pain may get worse when you pull on your ear.  Pus coming from the ear. How is this diagnosed? This condition may be diagnosed by examining the ear and testing fluid from the ear for bacteria and funguses. How is this treated? This condition may be treated with:  Antibiotic ear drops. These are often given for 10-14 days.  Medicines to reduce itching and swelling. Follow these instructions at home:  If you were prescribed antibiotic ear drops, use them as told by your health care provider. Do not stop using the antibiotic even if your condition improves.  Take over-the-counter and prescription medicines only as told by your health care provider.  Avoid getting water in your ears as told by your health care provider. This may include avoiding swimming or water sports for a few days.  Keep all follow-up visits as told by your health care provider. This is important. How is this prevented?  Keep your ears dry. Use the corner of a towel to dry your ears after you swim or bathe.  Avoid scratching or putting things in your ear. Doing these things can damage the ear canal or remove the protective wax that lines it, which makes it easier for bacteria and funguses to grow.   Avoid swimming in lakes, polluted water, or pools that may not have enough chlorine. Contact a health care provider if:  You have a fever.  Your ear is still red, swollen, painful, or draining pus after 3 days.  Your redness, swelling, or pain gets worse.  You have a severe headache.  You have redness, swelling, pain, or tenderness in the area behind your ear. Summary  Otitis externa is an infection of the outer ear canal.  Common causes include swimming in dirty water, moisture in the ear, or a cut or scrape in the ear.  Symptoms include pain, redness, and swelling of the ear.  If you were prescribed antibiotic ear drops, use them as told by your health care provider. Do not stop using the antibiotic even if your condition improves. This information is not intended to replace advice given to you by your health care provider. Make sure you discuss any questions you have with your health care provider. Document Released: 07/29/2005 Document Revised: 01/02/2018 Document Reviewed: 01/02/2018 [employer]vier Patient Education  2020 [employer]  

## 2019-05-03 NOTE — Progress Notes (Signed)
Subjective:  Patient ID: Blake Anderson, male    DOB: 1956-01-24  Age: 63 y.o. MRN: [id number]  CC: Hypertension and Otalgia   HPI Blake Anderson presents for f/up - He complains of a one-month history of left ear pain.  His hearing is normal.  He uses Q-tips.  He denies bleeding or drainage from the left ear.  Outpatient Medications Prior to Visit  Medication Sig Dispense Refill   aspirin 81 MG tablet Take 81 mg by mouth daily.     cholecalciferol (VITAMIN D3) 25 MCG (1000 UT) tablet Take 1,000 Units by mouth daily.     ciclopirox (LOPROX) 0.77 % SUSP Apply 1 Act topically 2 (two) times daily. 60 mL 1   EDARBYCLOR 40-12.5 MG TABS TAKE 1 TABLET BY MOUTH EVERY DAY 90 tablet 1   KLOR-CON M20 20 MEQ tablet TAKE 1 TABLET BY MOUTH 2 TIMES DAILY. FOLLOW-UP APPT DUE IN JUNE MUST SEE FOR FUTURE REFILLS 60 tablet 0   rosuvastatin (CRESTOR) 20 MG tablet TAKE 1 TABLET BY MOUTH EVERY DAY 90 tablet 0   No facility-administered medications prior to visit.     ROS Review of Systems  Constitutional: Positive for unexpected weight change (wt gain). Negative for diaphoresis and fatigue.  HENT: Positive for ear pain. Negative for facial swelling, hearing loss, sore throat and trouble swallowing.   Eyes: Negative.   Respiratory: Negative for cough, chest tightness, shortness of breath and wheezing.   Cardiovascular: Negative for chest pain, palpitations and leg swelling.  Gastrointestinal: Negative for abdominal pain, diarrhea, nausea and vomiting.  Endocrine: Negative.   Genitourinary: Negative.  Negative for difficulty urinating.  Musculoskeletal: Negative.   Skin: Negative.  Negative for color change and rash.  Neurological: Negative.  Negative for dizziness, weakness, light-headedness and headaches.  Hematological: Negative for adenopathy. Does not bruise/bleed easily.  Psychiatric/Behavioral: Negative.     Objective:  BP 124/78 (BP Location: Left Arm, Patient Position: Sitting, Cuff  Size: Large)    Pulse 92    Temp 98.1 F (36.7 C) (Oral)    Resp 16    Ht 5\' 3"  (1.6 m)    Wt 205 lb (93 kg)    SpO2 96%    BMI 36.31 kg/m   BP Readings from Last 3 Encounters:  05/03/19 124/78  07/28/18 130/78  04/20/18 104/76    Wt Readings from Last 3 Encounters:  05/03/19 205 lb (93 kg)  07/28/18 202 lb (91.6 kg)  04/20/18 202 lb (91.6 kg)    Physical Exam Vitals signs reviewed.  Constitutional:      General: He is not in acute distress.    Appearance: Normal appearance. He is obese. He is not ill-appearing, toxic-appearing or diaphoretic.  HENT:     Right Ear: Hearing, tympanic membrane, ear canal and external ear normal. There is no impacted cerumen.     Left Ear: Hearing, tympanic membrane and external ear normal.  No middle ear effusion. There is no impacted cerumen.     Ears:     Comments: Left EAC posterior distal aspect is erythematous and swollen.  There is no exudate or epidermal lesions.    Mouth/Throat:     Mouth: Mucous membranes are moist.  Eyes:     General: No scleral icterus.    Conjunctiva/sclera: Conjunctivae normal.  Neck:     Musculoskeletal: Normal range of motion. No neck rigidity.  Cardiovascular:     Rate and Rhythm: Normal rate and regular rhythm.     Heart sounds:  No murmur.  Pulmonary:     Effort: Pulmonary effort is normal.     Breath sounds: No wheezing, rhonchi or rales.  Abdominal:     General: Abdomen is protuberant. Bowel sounds are normal. There is no distension.     Palpations: Abdomen is soft. There is no hepatomegaly or splenomegaly.  Lymphadenopathy:     Head:     Right side of head: No submental, submandibular, tonsillar, preauricular, posterior auricular or occipital adenopathy.     Left side of head: No submental, submandibular, tonsillar, preauricular, posterior auricular or occipital adenopathy.     Cervical: No cervical adenopathy.     Right cervical: No superficial, deep or posterior cervical adenopathy.    Left  cervical: No superficial, deep or posterior cervical adenopathy.     Upper Body:     Right upper body: No supraclavicular adenopathy.  Neurological:     Mental Status: He is alert.     Lab Results  Component Value Date   WBC 8.6 04/14/2018   HGB 15.1 04/14/2018   HCT 43.1 04/14/2018   PLT 256.0 04/14/2018   GLUCOSE 78 07/28/2018   CHOL 114 04/14/2018   TRIG 88.0 04/14/2018   HDL 42.80 04/14/2018   LDLDIRECT 70.0 02/11/2017   LDLCALC 54 04/14/2018   ALT 28 02/11/2017   AST 24 02/11/2017   NA 137 07/28/2018   K 3.7 07/28/2018   CL 102 07/28/2018   CREATININE 0.88 07/28/2018   BUN 11 07/28/2018   CO2 25 07/28/2018   TSH 2.70 04/14/2018   PSA 0.93 07/28/2018   HGBA1C 5.7 02/11/2017    Mr Brain Wo Contrast  Result Date: 02/11/2017 CLINICAL DATA:  63 year old male with 10 days of unexplained headache and dizziness. Ataxia. EXAM: MRI HEAD WITHOUT CONTRAST TECHNIQUE: Multiplanar, multiecho pulse sequences of the brain and surrounding structures were obtained without intravenous contrast. COMPARISON:  None. FINDINGS: Brain: Cerebral volume is within normal limits for age. No restricted diffusion to suggest acute infarction. No midline shift, mass effect, evidence of mass lesion, ventriculomegaly, extra-axial collection or acute intracranial hemorrhage. Cervicomedullary junction and pituitary are within normal limits. Wallace Cullens and white matter signal is within normal limits for age throughout the brain. No cortical encephalomalacia or chronic cerebral blood products. Deep gray matter nuclei, brainstem, and cerebellum appear normal. Vascular: Major intracranial vascular flow voids are preserved. Skull and upper cervical spine: Negative. Normal bone marrow signal. Sinuses/Orbits: Disc conjugate gaze but otherwise negative orbit soft tissues. Paranasal sinuses are clear. Other: Mastoid air cells are clear. Visible internal auditory structures appear normal. Negative scalp soft tissues. IMPRESSION:  No acute intracranial abnormality and normal for age noncontrast Normal MRI appearance of the brain. Electronically Signed   By: Odessa Fleming M.D.   On: 02/11/2017 17:33    Assessment & Plan:   [name] was seen today for hypertension and otalgia.  Diagnoses and all orders for this visit:  Need for influenza vaccination -     Flu Vaccine QUAD 36+ mos IM  Colon cancer screening -     Cologuard  Chronic swimmer's ear of left side -     ciprofloxacin-hydrocortisone (CIPRO HC OTIC) OTIC suspension; Place 3 drops into the left ear 2 (two) times daily for 10 days.  Essential hypertension, benign- His blood pressure is well controlled.  Electrolytes and renal function are normal. -     CBC with Differential/Platelet; Future -     Basic metabolic panel; Future   I am having Blake Rhine "Daylen"  start on ciprofloxacin-hydrocortisone. I am also having him maintain his aspirin, ciclopirox, cholecalciferol, Edarbyclor, Klor-Con M20, and rosuvastatin.  Meds ordered this encounter  Medications   ciprofloxacin-hydrocortisone (CIPRO HC OTIC) OTIC suspension    Sig: Place 3 drops into the left ear 2 (two) times daily for 10 days.    Dispense:  10 mL    Refill:  0     Follow-up: No follow-ups on file.  Scarlette Calico, MD

## 2019-05-07 ENCOUNTER — Telehealth: Payer: Self-pay

## 2019-05-07 NOTE — Telephone Encounter (Signed)
Key: [employer]  PA stated that medication is a covered drug. Cost for pt is 200 for 90 day supply. PA started for tier reduction.   PA case ID is: [id number]

## 2019-05-10 ENCOUNTER — Other Ambulatory Visit: Payer: Self-pay | Admitting: Internal Medicine

## 2019-05-11 ENCOUNTER — Other Ambulatory Visit: Payer: Self-pay | Admitting: Internal Medicine

## 2019-05-11 DIAGNOSIS — I10 Essential (primary) hypertension: Secondary | ICD-10-CM

## 2019-05-11 MED ORDER — EDARBYCLOR 40-12.5 MG PO TABS: 1.0000 | ORAL_TABLET | Freq: Every day | ORAL | 1 refills | Status: DC

## 2019-05-11 NOTE — Telephone Encounter (Signed)
Tier exception that I request was denied due to: Tier cost reduction is not available per your plan benefit.   LVM for pt to call back so I can go over this with him.

## 2019-05-11 NOTE — Telephone Encounter (Signed)
Pt is requesting refill of Edarbyclor.   Pt contacted and I informed of the PA tier exception status. Informed pt that I had a copay card I can mail and also instructed on how he can go online and activate copay card that way. Pt stated understanding.

## 2019-05-11 NOTE — Telephone Encounter (Signed)
Pt returned vm from Jenkins who was unavailable. Please advise.

## 2019-05-29 IMAGING — MR MR HEAD W/O CM
9 of 10 series · 34 of 48 positions shown · non-contrast
Comparison: None.

CLINICAL DATA: 60-year-old male with 10 days of unexplained
headache and dizziness. Ataxia.

EXAM:
MRI HEAD WITHOUT CONTRAST
TECHNIQUE: Multiplanar, multiecho pulse sequences of the brain and surrounding
structures were obtained without intravenous contrast.

[Series 3: DWI · axial · 3.0mm · 0.94mm/px · z∈[-73,+77]mm · 8 of 102 slices shown (1 of 2)]
[im 1/102]
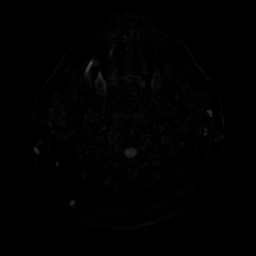
[im 15/102]
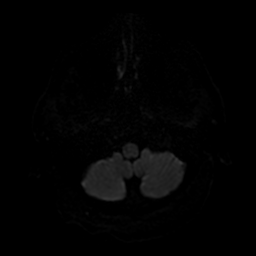
[im 29/102]
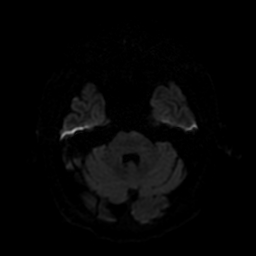
[im 44/102]
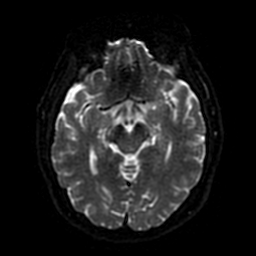
[im 58/102]
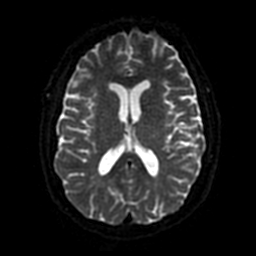
[im 73/102]
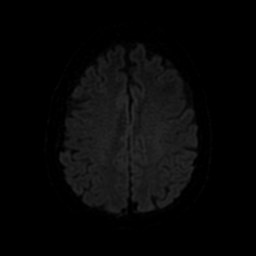
[im 87/102]
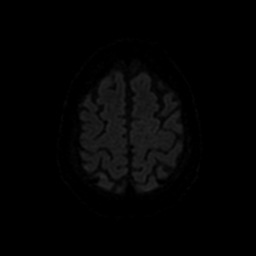
[im 102/102]
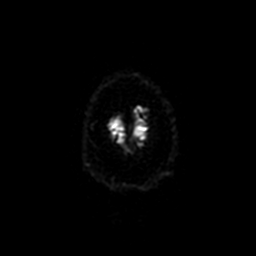

[Series 4: FLAIR · sagittal · 5.0mm · 0.47mm/px · 2 of 24 slices shown (1 of 2)]
[im 1/24]
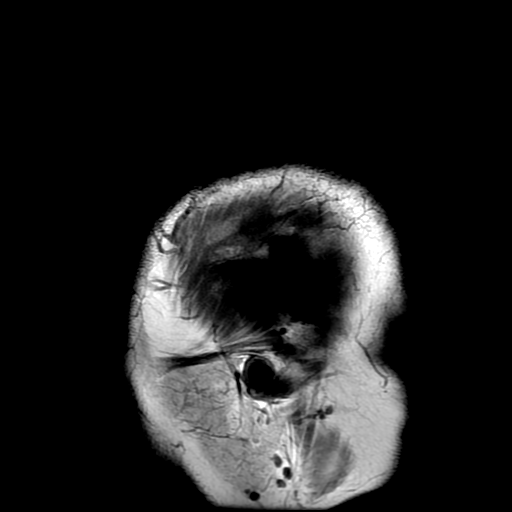
[im 24/24]
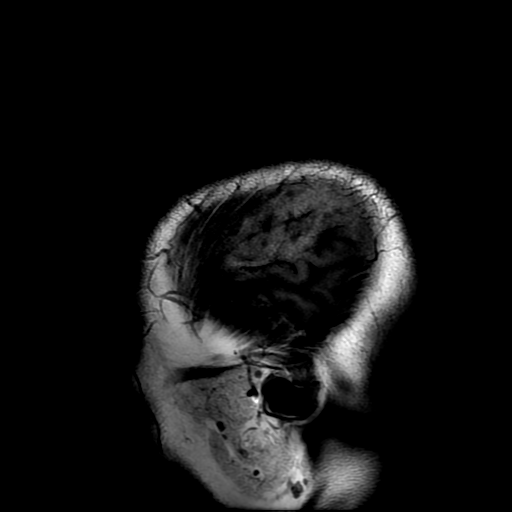

[Series 5: T2 · axial · 5.0mm · 0.51mm/px · z∈[-90,+78]mm · 3 of 29 slices shown (1 of 2)]
[im 1/29]
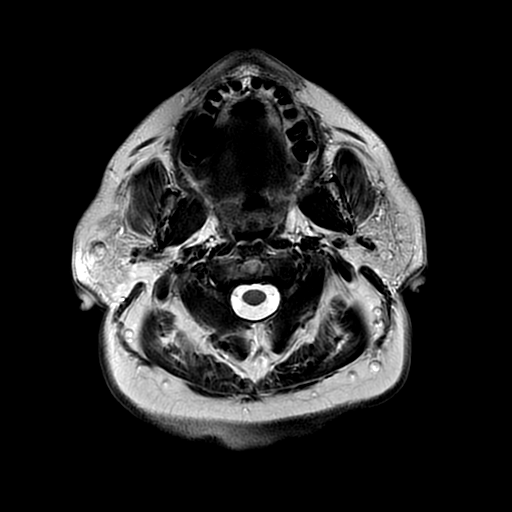
[im 15/29]
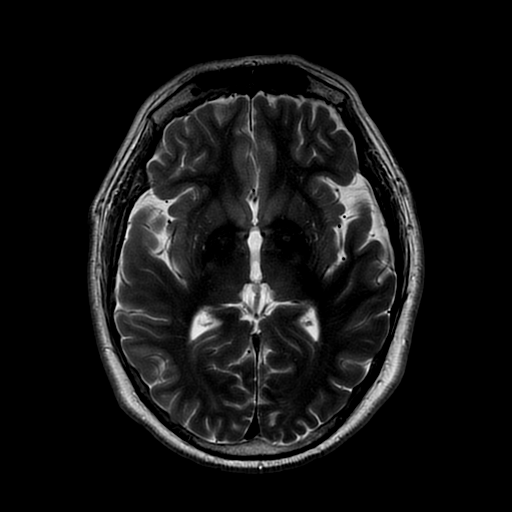
[im 29/29]
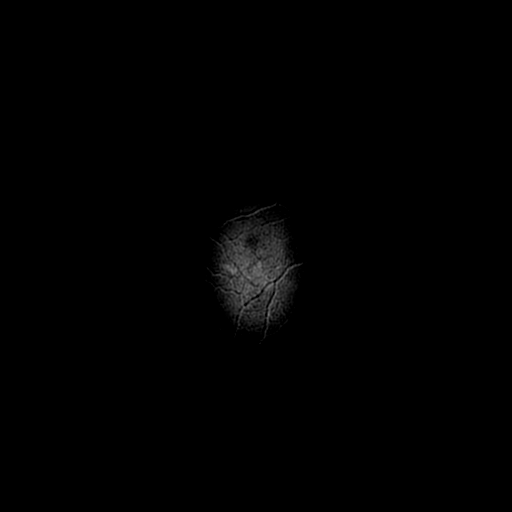

[Series 6: FLAIR · axial · 5.0mm · 0.51mm/px · z∈[-90,+78]mm · 3 of 29 slices shown (2 of 2)]
[im 1/29]
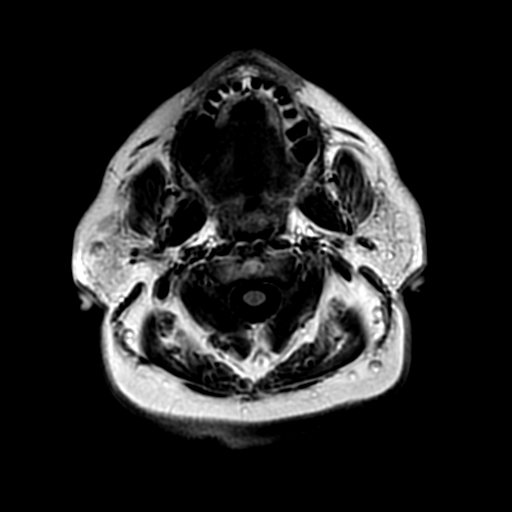
[im 15/29]
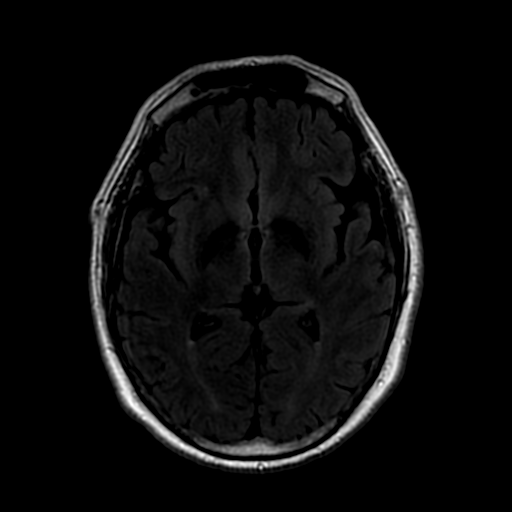
[im 29/29]
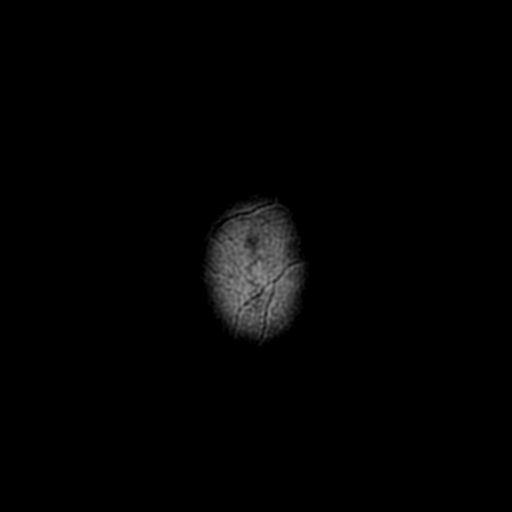

[Series 7: (person_name) · axial · 3.0mm · 0.51mm/px · 1 of 116 slices shown]
[im 1/116]
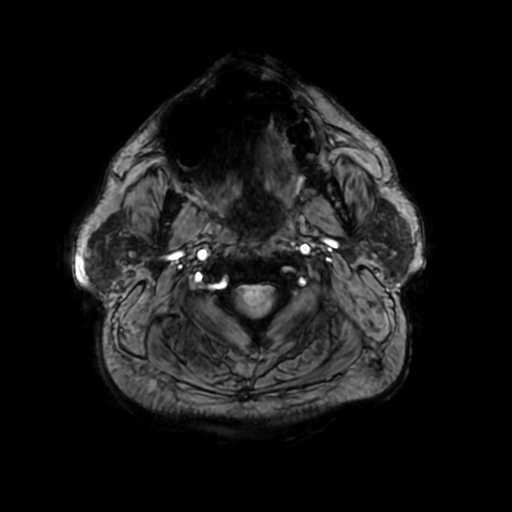

[Series 9: DWI · coronal · 4.0mm · 0.94mm/px · 7 of 78 slices shown (2 of 2)]
[im 1/78]
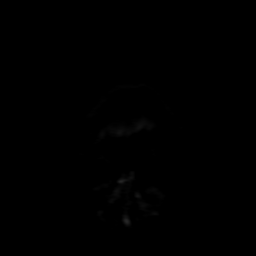
[im 13/78]
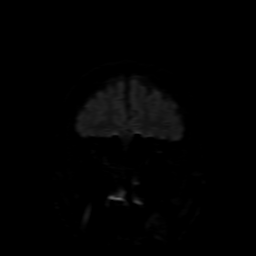
[im 26/78]
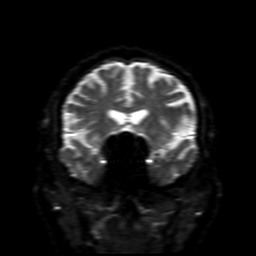
[im 39/78]
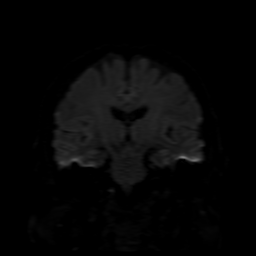
[im 52/78]
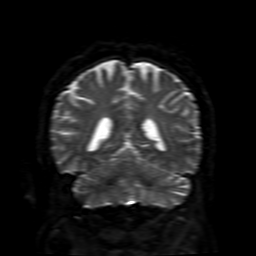
[im 65/78]
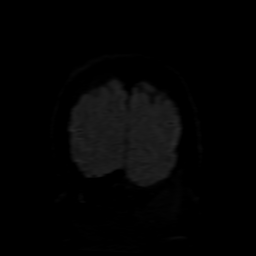
[im 78/78]
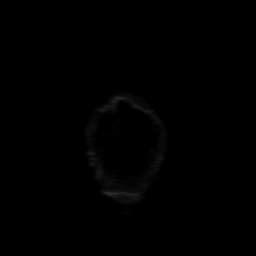

[Series 10: T2 · coronal · 5.0mm · 0.47mm/px · 3 of 29 slices shown (2 of 2)]
[im 1/29]
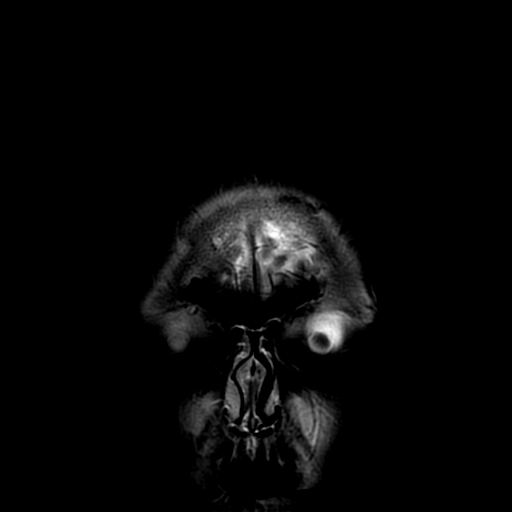
[im 15/29]
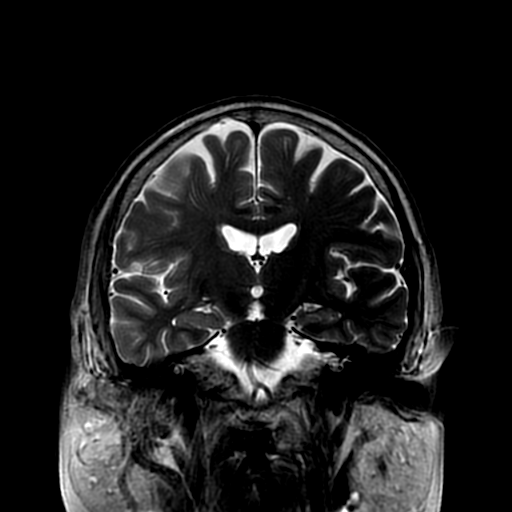
[im 29/29]
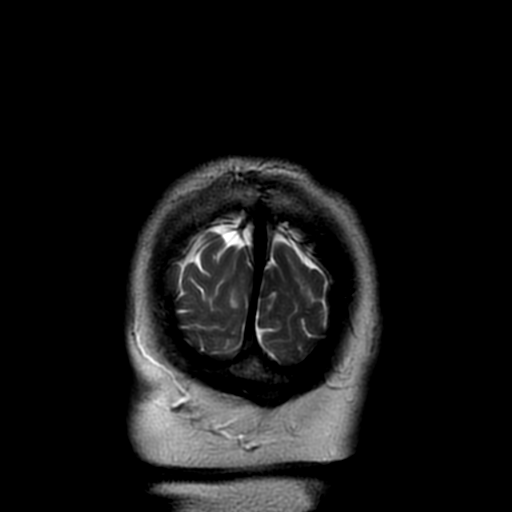

[Series 350: ADC · axial · 3.0mm · 0.94mm/px · z∈[-73,+77]mm · 4 of 50 slices shown (1 of 2)]
[im 1/50]
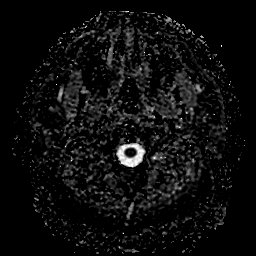
[im 17/50]
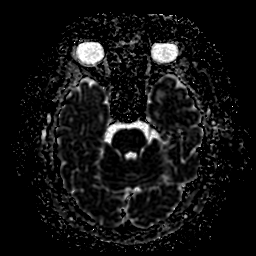
[im 33/50]
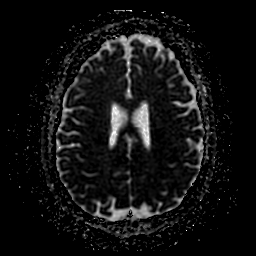
[im 50/50]
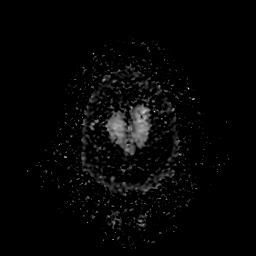

[Series 950: ADC · coronal · 4.0mm · 0.94mm/px · 3 of 39 slices shown (2 of 2)]
[im 1/39]
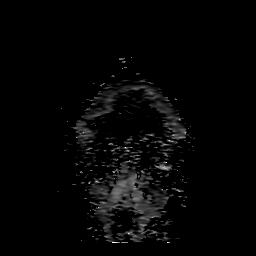
[im 20/39]
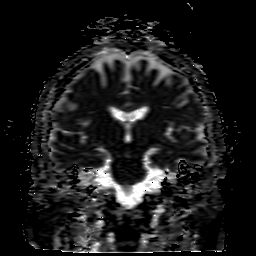
[im 39/39]
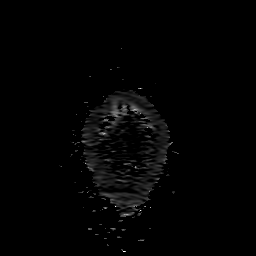

[34 of 48 positions shown; findings below may reference images not displayed]

FINDINGS: Brain: Cerebral volume is within normal limits for age. No
restricted diffusion to suggest acute infarction. No midline shift,
mass effect, evidence of mass lesion, ventriculomegaly, extra-axial
collection or acute intracranial hemorrhage. Cervicomedullary
junction and pituitary are within normal limits.

Gray and white matter signal is within normal limits for age
throughout the brain. No cortical encephalomalacia or chronic
cerebral blood products. Deep gray matter nuclei, brainstem, and
cerebellum appear normal.

Vascular: Major intracranial vascular flow voids are preserved.

Skull and upper cervical spine: Negative. Normal bone marrow signal.

Sinuses/Orbits: Disc conjugate gaze but otherwise negative orbit
soft tissues. Paranasal sinuses are clear.

Other: Mastoid air cells are clear. Visible internal auditory
structures appear normal. Negative scalp soft tissues.
IMPRESSION: No acute intracranial abnormality and normal for age noncontrast
Normal MRI appearance of the brain.

## 2019-07-27 LAB — COLOGUARD: Cologuard: NEGATIVE

## 2019-07-29 ENCOUNTER — Other Ambulatory Visit: Payer: Self-pay | Admitting: Internal Medicine

## 2019-07-29 DIAGNOSIS — E785 Hyperlipidemia, unspecified: Secondary | ICD-10-CM

## 2019-08-19 ENCOUNTER — Encounter: Payer: Self-pay | Admitting: Internal Medicine

## 2019-08-19 NOTE — Progress Notes (Unsigned)
Pt contacted and informed of negative result. Pt stated understanding,

## 2019-11-01 ENCOUNTER — Other Ambulatory Visit: Payer: Self-pay | Admitting: Internal Medicine

## 2019-11-01 DIAGNOSIS — I10 Essential (primary) hypertension: Secondary | ICD-10-CM

## 2020-01-09 ENCOUNTER — Other Ambulatory Visit: Payer: Self-pay | Admitting: Internal Medicine

## 2020-01-09 DIAGNOSIS — E785 Hyperlipidemia, unspecified: Secondary | ICD-10-CM

## 2020-04-05 ENCOUNTER — Other Ambulatory Visit: Payer: Self-pay | Admitting: Internal Medicine

## 2020-04-05 DIAGNOSIS — I10 Essential (primary) hypertension: Secondary | ICD-10-CM

## 2020-05-08 ENCOUNTER — Other Ambulatory Visit: Payer: Self-pay | Admitting: Internal Medicine

## 2020-05-08 DIAGNOSIS — I10 Essential (primary) hypertension: Secondary | ICD-10-CM

## 2020-05-09 ENCOUNTER — Other Ambulatory Visit: Payer: Self-pay | Admitting: Internal Medicine

## 2020-05-09 DIAGNOSIS — I10 Essential (primary) hypertension: Secondary | ICD-10-CM

## 2020-05-11 ENCOUNTER — Encounter: Payer: Self-pay | Admitting: Internal Medicine

## 2020-05-11 ENCOUNTER — Ambulatory Visit (INDEPENDENT_AMBULATORY_CARE_PROVIDER_SITE_OTHER): Payer: 59 | Admitting: Internal Medicine

## 2020-05-11 ENCOUNTER — Other Ambulatory Visit: Payer: Self-pay

## 2020-05-11 VITALS — BP 118/70 | HR 91 | Temp 98.1°F | Resp 16 | Ht 63.0 in | Wt 211.6 lb

## 2020-05-11 DIAGNOSIS — M7989 Other specified soft tissue disorders: Secondary | ICD-10-CM

## 2020-05-11 DIAGNOSIS — I10 Essential (primary) hypertension: Secondary | ICD-10-CM | POA: Diagnosis not present

## 2020-05-11 DIAGNOSIS — Z0000 Encounter for general adult medical examination without abnormal findings: Secondary | ICD-10-CM

## 2020-05-11 DIAGNOSIS — Z23 Encounter for immunization: Secondary | ICD-10-CM

## 2020-05-11 DIAGNOSIS — R7989 Other specified abnormal findings of blood chemistry: Secondary | ICD-10-CM | POA: Diagnosis not present

## 2020-05-11 DIAGNOSIS — E876 Hypokalemia: Secondary | ICD-10-CM | POA: Diagnosis not present

## 2020-05-11 DIAGNOSIS — T502X5A Adverse effect of carbonic-anhydrase inhibitors, benzothiadiazides and other diuretics, initial encounter: Secondary | ICD-10-CM

## 2020-05-11 NOTE — Progress Notes (Signed)
Subjective:  Patient ID: Blake Anderson, male    DOB: 03/11/1956  Age: 64 y.o. MRN: [id number]  CC: Annual Exam and Hypertension  This visit occurred during the SARS-CoV-2 public health emergency.  Safety protocols were in place, including screening questions prior to the visit, additional usage of staff PPE, and extensive cleaning of exam room while observing appropriate contact time as indicated for disinfecting solutions.    HPI Blake Anderson presents for a CPX.  He fractured his right arm about 6 weeks ago. The arm remains in a sling. He complains of the last week that he has developed mild swelling in his right hand. The hand is not painful and he denies paresthesias. He tells me his blood pressure has been well controlled. He denies dizziness, lightheadedness, chest pain, shortness of breath, headache, edema, or fatigue.   Outpatient Medications Prior to Visit  Medication Sig Dispense Refill  . aspirin 81 MG tablet Take 81 mg by mouth daily.    . cholecalciferol (VITAMIN D3) 25 MCG (1000 UT) tablet Take 1,000 Units by mouth daily.    Marland Kitchen HYDROcodone-acetaminophen (NORCO/VICODIN) 5-325 MG tablet Take 1 tablet by mouth every 6 (six) hours as needed for moderate pain.    Marland Kitchen EDARBYCLOR 40-12.5 MG TABS TAKE 1 TABLET BY MOUTH EVERY DAY 90 tablet 1  . rosuvastatin (CRESTOR) 20 MG tablet TAKE 1 TABLET BY MOUTH EVERY DAY 90 tablet 1  . ciclopirox (LOPROX) 0.77 % SUSP Apply 1 Act topically 2 (two) times daily. (Patient not taking: Reported on 05/11/2020) 60 mL 1  . KLOR-CON M20 20 MEQ tablet TAKE 1 TABLET BY MOUTH 2 TIMES DAILY. FOLLOW-UP APPT DUE IN JUNE MUST SEE FOR FUTURE REFILLS (Patient not taking: Reported on 05/11/2020) 60 tablet 0   No facility-administered medications prior to visit.    ROS Review of Systems  Constitutional: Negative.  Negative for diaphoresis and fatigue.  HENT: Negative.   Eyes: Negative.   Respiratory: Negative for cough, chest tightness, shortness of breath and  wheezing.   Cardiovascular: Negative for chest pain, palpitations and leg swelling.  Gastrointestinal: Negative.  Negative for abdominal pain, constipation, diarrhea, nausea and vomiting.  Endocrine: Negative.   Genitourinary: Negative.  Negative for difficulty urinating.  Musculoskeletal: Positive for arthralgias. Negative for back pain and neck pain.  Skin: Negative.  Negative for color change.  Neurological: Negative.  Negative for dizziness, facial asymmetry, weakness, light-headedness, numbness and headaches.  Hematological: Negative for adenopathy. Does not bruise/bleed easily.  Psychiatric/Behavioral: Negative.     Objective:  BP 118/70 (BP Location: Right Arm, Patient Position: Sitting, Cuff Size: Normal)   Pulse 91   Temp 98.1 F (36.7 C) (Oral)   Resp 16   Ht 5\' 3"  (1.6 m)   Wt 211 lb 9.6 oz (96 kg)   SpO2 97%   BMI 37.48 kg/m   BP Readings from Last 3 Encounters:  05/11/20 118/70  05/03/19 124/78  07/28/18 130/78    Wt Readings from Last 3 Encounters:  05/11/20 211 lb 9.6 oz (96 kg)  05/03/19 205 lb (93 kg)  07/28/18 202 lb (91.6 kg)    Physical Exam Vitals reviewed.  Constitutional:      Appearance: He is obese.  HENT:     Nose: Nose normal.     Mouth/Throat:     Mouth: Mucous membranes are moist.  Eyes:     General: No scleral icterus.    Conjunctiva/sclera: Conjunctivae normal.  Cardiovascular:     Rate and Rhythm: Normal  rate and regular rhythm.     Heart sounds: No murmur heard.   Pulmonary:     Effort: Pulmonary effort is normal.     Breath sounds: No stridor. No wheezing, rhonchi or rales.  Abdominal:     General: Abdomen is protuberant. Bowel sounds are normal. There is no distension.     Palpations: Abdomen is soft. There is no hepatomegaly, splenomegaly or mass.     Tenderness: There is no abdominal tenderness.  Genitourinary:    Comments: He was not willing to undress for a GU or rectal exam. Musculoskeletal:        General: Normal  range of motion.     Cervical back: Neck supple.     Right lower leg: No edema.     Left lower leg: No edema.     Comments: Right upper extremity is in a sling. There is mild swelling on the dorsum of the right hand. In the right hand there is good sensation and capillary refill.  Lymphadenopathy:     Cervical: No cervical adenopathy.  Skin:    General: Skin is warm and dry.     Coloration: Skin is not pale.     Findings: No rash.  Neurological:     General: No focal deficit present.     Mental Status: He is alert.  Psychiatric:        Mood and Affect: Mood normal.        Behavior: Behavior normal.     Lab Results  Component Value Date   WBC 9.0 05/11/2020   HGB 15.6 05/11/2020   HCT 45.7 05/11/2020   PLT 259 05/11/2020   GLUCOSE 82 05/11/2020   CHOL 135 05/11/2020   TRIG 160 (H) 05/11/2020   HDL 47 05/11/2020   LDLDIRECT 70.0 02/11/2017   LDLCALC 64 05/11/2020   ALT 28 02/11/2017   AST 24 02/11/2017   NA 137 05/11/2020   K 3.9 05/11/2020   CL 99 05/11/2020   CREATININE 1.03 05/11/2020   BUN 12 05/11/2020   CO2 29 05/11/2020   TSH 6.27 (H) 05/11/2020   PSA 1.06 05/11/2020   HGBA1C 5.7 02/11/2017    MR BRAIN WO CONTRAST  Result Date: 02/11/2017 CLINICAL DATA:  64 year old male with 10 days of unexplained headache and dizziness. Ataxia. EXAM: MRI HEAD WITHOUT CONTRAST TECHNIQUE: Multiplanar, multiecho pulse sequences of the brain and surrounding structures were obtained without intravenous contrast. COMPARISON:  None. FINDINGS: Brain: Cerebral volume is within normal limits for age. No restricted diffusion to suggest acute infarction. No midline shift, mass effect, evidence of mass lesion, ventriculomegaly, extra-axial collection or acute intracranial hemorrhage. Cervicomedullary junction and pituitary are within normal limits. Wallace Cullens and white matter signal is within normal limits for age throughout the brain. No cortical encephalomalacia or chronic cerebral blood products.  Deep gray matter nuclei, brainstem, and cerebellum appear normal. Vascular: Major intracranial vascular flow voids are preserved. Skull and upper cervical spine: Negative. Normal bone marrow signal. Sinuses/Orbits: Disc conjugate gaze but otherwise negative orbit soft tissues. Paranasal sinuses are clear. Other: Mastoid air cells are clear. Visible internal auditory structures appear normal. Negative scalp soft tissues. IMPRESSION: No acute intracranial abnormality and normal for age noncontrast Normal MRI appearance of the brain. Electronically Signed   By: Odessa Fleming M.D.   On: 02/11/2017 17:33    Assessment & Plan:   [name] was seen today for annual exam and hypertension.  Diagnoses and all orders for this visit:  Needs flu shot -  Flu Vaccine QUAD 36+ mos IM  Swelling of arm-D-dimer is normal which is reassuring that he does not have a deep venous thrombosis. Examination of right upper extremity shows that he is neurovascularly intact. This is likely dependent edema. -     CBC with Differential/Platelet; Future -     D-dimer, quantitative (not at [facility]); Future -     D-dimer, quantitative (not at [facility]) -     CBC with Differential/Platelet  Essential hypertension, benign- His blood pressure is adequately well controlled. Electrolytes and renal function are normal. Will continue the combination of an ARB and thiazide diuretic. -     CBC with Differential/Platelet; Future -     BASIC METABOLIC PANEL WITH GFR; Future -     TSH; Future -     TSH -     BASIC METABOLIC PANEL WITH GFR -     CBC with Differential/Platelet  Routine general medical examination at a health care facility- Exam completed, labs reviewed, vaccines reviewed and updated, cancer screenings are up-to-date, patient education was given. -     Lipid panel; Future -     PSA; Future -     PSA -     Lipid panel  Diuretic-induced hypokalemia- His potassium level is normal now. Will continue the current potassium  supplement.  TSH elevation- His TSH is mildly elevated but he appears euthyroid. He will return in the next month to have this rechecked.   I am having Carola Rhine "Khiry" maintain his aspirin, ciclopirox, cholecalciferol, Klor-Con M20, and HYDROcodone-acetaminophen.  No orders of the defined types were placed in this encounter.    Follow-up: No follow-ups on file.  Sanda Linger, MD

## 2020-05-12 ENCOUNTER — Telehealth: Payer: Self-pay | Admitting: Emergency Medicine

## 2020-05-12 LAB — BASIC METABOLIC PANEL WITH GFR
BUN: 12 mg/dL (ref 7–25)
CO2: 29 mmol/L (ref 20–32)
Calcium: 10.2 mg/dL (ref 8.6–10.3)
Chloride: 99 mmol/L (ref 98–110)
Creat: 1.03 mg/dL (ref 0.70–1.25)
GFR, Est African American: 89 mL/min/{1.73_m2} (ref >=60)
GFR, Est Non African American: 76 mL/min/{1.73_m2} (ref >=60)
Glucose, Bld: 82 mg/dL (ref 65–99)
Potassium: 3.9 mmol/L (ref 3.5–5.3)
Sodium: 137 mmol/L (ref 135–146)

## 2020-05-12 LAB — CBC WITH DIFFERENTIAL/PLATELET
Absolute Monocytes: 1026 cells/uL — ABNORMAL HIGH (ref 200–950)
Basophils Absolute: 99 cells/uL (ref 0–200)
Basophils Relative: 1.1 %
Eosinophils Absolute: 270 cells/uL (ref 15–500)
Eosinophils Relative: 3 %
HCT: 45.7 % (ref 38.5–50.0)
Hemoglobin: 15.6 g/dL (ref 13.2–17.1)
Lymphs Abs: 1719 cells/uL (ref 850–3900)
MCH: 30.2 pg (ref 27.0–33.0)
MCHC: 34.1 g/dL (ref 32.0–36.0)
MCV: 88.4 fL (ref 80.0–100.0)
MPV: 9.7 fL (ref 7.5–12.5)
Monocytes Relative: 11.4 %
Neutro Abs: 5886 cells/uL (ref 1500–7800)
Neutrophils Relative %: 65.4 %
Platelets: 259 10*3/uL (ref 140–400)
RBC: 5.17 10*6/uL (ref 4.20–5.80)
RDW: 12.6 % (ref 11.0–15.0)
Total Lymphocyte: 19.1 %
WBC: 9 10*3/uL (ref 3.8–10.8)

## 2020-05-12 LAB — LIPID PANEL
Cholesterol: 135 mg/dL (ref <200)
HDL: 47 mg/dL (ref >=40)
LDL Cholesterol (Calc): 64 mg/dL (calc)
Non-HDL Cholesterol (Calc): 88 mg/dL (calc) (ref <130)
Total CHOL/HDL Ratio: 2.9 (calc) (ref <5.0)
Triglycerides: 160 mg/dL — ABNORMAL HIGH (ref <150)

## 2020-05-12 LAB — TSH: TSH: 6.27 mIU/L — ABNORMAL HIGH (ref 0.40–4.50)

## 2020-05-12 LAB — PSA: PSA: 1.06 ng/mL (ref <=4.0)

## 2020-05-12 LAB — D-DIMER, QUANTITATIVE: D-Dimer, Quant: 0.37 mcg/mL FEU (ref <0.50)

## 2020-05-12 NOTE — Telephone Encounter (Signed)
Pt is calling requesting a refill on his EDARBYCLOR 40-12.5 MG TABS. Pharmacy is [facility] [place]. Please give the patient a call when medication has been called in. Thanks.

## 2020-05-13 ENCOUNTER — Other Ambulatory Visit: Payer: Self-pay | Admitting: Internal Medicine

## 2020-05-13 DIAGNOSIS — E785 Hyperlipidemia, unspecified: Secondary | ICD-10-CM

## 2020-05-13 DIAGNOSIS — I10 Essential (primary) hypertension: Secondary | ICD-10-CM

## 2020-05-14 ENCOUNTER — Encounter: Payer: Self-pay | Admitting: Internal Medicine

## 2020-05-14 NOTE — Patient Instructions (Signed)

## 2020-05-15 ENCOUNTER — Other Ambulatory Visit: Payer: Self-pay | Admitting: Internal Medicine

## 2020-08-07 ENCOUNTER — Other Ambulatory Visit: Payer: 59

## 2020-08-07 DIAGNOSIS — Z20822 Contact with and (suspected) exposure to covid-19: Secondary | ICD-10-CM

## 2020-08-08 ENCOUNTER — Encounter: Payer: Self-pay | Admitting: Internal Medicine

## 2020-08-09 LAB — SARS-COV-2, NAA 2 DAY TAT

## 2020-08-09 LAB — NOVEL CORONAVIRUS, NAA: SARS-CoV-2, NAA: NOT DETECTED

## 2020-10-09 ENCOUNTER — Encounter: Payer: Self-pay | Admitting: Internal Medicine

## 2020-10-10 ENCOUNTER — Other Ambulatory Visit: Payer: Self-pay

## 2020-10-10 ENCOUNTER — Ambulatory Visit (INDEPENDENT_AMBULATORY_CARE_PROVIDER_SITE_OTHER): Payer: 59 | Admitting: Internal Medicine

## 2020-10-10 ENCOUNTER — Encounter: Payer: Self-pay | Admitting: Internal Medicine

## 2020-10-10 VITALS — BP 116/78 | HR 70 | Temp 98.1°F | Resp 16 | Ht 63.0 in | Wt 218.0 lb

## 2020-10-10 DIAGNOSIS — I10 Essential (primary) hypertension: Secondary | ICD-10-CM

## 2020-10-10 DIAGNOSIS — E785 Hyperlipidemia, unspecified: Secondary | ICD-10-CM | POA: Diagnosis not present

## 2020-10-10 DIAGNOSIS — E876 Hypokalemia: Secondary | ICD-10-CM | POA: Diagnosis not present

## 2020-10-10 DIAGNOSIS — R7989 Other specified abnormal findings of blood chemistry: Secondary | ICD-10-CM | POA: Diagnosis not present

## 2020-10-10 DIAGNOSIS — T502X5A Adverse effect of carbonic-anhydrase inhibitors, benzothiadiazides and other diuretics, initial encounter: Secondary | ICD-10-CM

## 2020-10-10 LAB — BASIC METABOLIC PANEL
BUN: 11 mg/dL (ref 6–23)
CO2: 23 mEq/L (ref 19–32)
Calcium: 9.3 mg/dL (ref 8.4–10.5)
Chloride: 100 mEq/L (ref 96–112)
Creatinine, Ser: 0.9 mg/dL (ref 0.40–1.50)
GFR: 89.81 mL/min (ref >60.00)
Glucose, Bld: 85 mg/dL (ref 70–99)
Potassium: 3.6 mEq/L (ref 3.5–5.1)
Sodium: 133 mEq/L — ABNORMAL LOW (ref 135–145)

## 2020-10-10 LAB — MAGNESIUM: Magnesium: 1.8 mg/dL (ref 1.5–2.5)

## 2020-10-10 MED ORDER — IRBESARTAN 300 MG PO TABS
300.0000 mg | ORAL_TABLET | Freq: Every day | ORAL | 1 refills | Status: DC
Start: 2020-10-10 — End: 2021-04-02

## 2020-10-10 MED ORDER — ROSUVASTATIN CALCIUM 20 MG PO TABS: 20.0000 mg | ORAL_TABLET | Freq: Every day | ORAL | 1 refills | Status: DC

## 2020-10-10 NOTE — Patient Instructions (Signed)

## 2020-10-10 NOTE — Progress Notes (Signed)
Subjective:  Patient ID: Blake Anderson, male    DOB: [birth year]  Age: 65 y.o. MRN: [id number]  CC: Hypertension and Hyperlipidemia  This visit occurred during the SARS-CoV-2 public health emergency.  Safety protocols were in place, including screening questions prior to the visit, additional usage of staff PPE, and extensive cleaning of exam room while observing appropriate contact time as indicated for disinfecting solutions.    HPI [name] presents for f/up -  He wants to know his blood type.  He had a COVID-19 infection about 3 weeks ago.  He tells me all of his symptoms have resolved.  He is active and denies any recent episodes of chest pain, shortness of breath, palpitations, or edema.  He complains of weight gain.  Outpatient Medications Prior to Visit  Medication Sig Dispense Refill  . aspirin 81 MG tablet Take 81 mg by mouth daily.    . cholecalciferol (VITAMIN D3) 25 MCG (1000 UT) tablet Take 1,000 Units by mouth daily.    . ciclopirox (LOPROX) 0.77 % SUSP Apply 1 Act topically 2 (two) times daily. 60 mL 1  . EDARBYCLOR 40-12.5 MG TABS TAKE 1 TABLET BY MOUTH EVERY DAY 90 tablet 1  . HYDROcodone-acetaminophen (NORCO/VICODIN) 5-325 MG tablet Take 1 tablet by mouth every 6 (six) hours as needed for moderate pain.    Marland Kitchen KLOR-CON M20 20 MEQ tablet TAKE 1 TABLET BY MOUTH 2 TIMES DAILY. FOLLOW-UP APPT DUE IN JUNE MUST SEE FOR FUTURE REFILLS 60 tablet 0  . rosuvastatin (CRESTOR) 20 MG tablet TAKE 1 TABLET BY MOUTH EVERY DAY 90 tablet 1   No facility-administered medications prior to visit.    ROS Review of Systems  Constitutional: Positive for unexpected weight change (wt gain). Negative for chills, diaphoresis and fatigue.  HENT: Negative.   Eyes: Negative.   Respiratory: Negative for cough, chest tightness, shortness of breath and wheezing.   Cardiovascular: Negative for chest pain, palpitations and leg swelling.  Gastrointestinal: Negative for abdominal pain,  constipation and diarrhea.  Endocrine: Negative.   Genitourinary: Negative.  Negative for difficulty urinating.  Musculoskeletal: Negative for arthralgias, back pain, myalgias and neck pain.  Skin: Negative.  Negative for color change and pallor.  Neurological: Negative.  Negative for dizziness, weakness and light-headedness.  Hematological: Negative for adenopathy. Does not bruise/bleed easily.  Psychiatric/Behavioral: Negative.     Objective:  BP 116/78   Pulse 70   Temp 98.1 F (36.7 C) (Oral)   Resp 16   Ht 5\' 3"  (1.6 m)   Wt 218 lb (98.9 kg)   SpO2 96%   BMI 38.62 kg/m   BP Readings from Last 3 Encounters:  10/10/20 116/78  05/11/20 118/70  05/03/19 124/78    Wt Readings from Last 3 Encounters:  10/10/20 218 lb (98.9 kg)  05/11/20 211 lb 9.6 oz (96 kg)  05/03/19 205 lb (93 kg)    Physical Exam Vitals reviewed. Exam conducted with a chaperone present.  HENT:     Mouth/Throat:     Mouth: Mucous membranes are moist.  Eyes:     General: No scleral icterus.    Conjunctiva/sclera: Conjunctivae normal.  Cardiovascular:     Rate and Rhythm: Normal rate and regular rhythm.     Heart sounds: No murmur heard.   Pulmonary:     Effort: Pulmonary effort is normal.     Breath sounds: No stridor. No wheezing, rhonchi or rales.  Abdominal:     General: Abdomen is protuberant. There is no  distension.     Palpations: Abdomen is soft. There is no fluid wave, hepatomegaly or mass.     Tenderness: There is no abdominal tenderness.     Hernia: There is no hernia in the left inguinal area or right inguinal area.  Genitourinary:    Pubic Area: No rash.      Penis: Normal and circumcised. No discharge, swelling or lesions.      Testes: Normal.        Right: Mass, tenderness or swelling not present.        Left: Mass, tenderness or swelling not present.     Epididymis:     Right: Normal. Not inflamed or enlarged. No mass.     Left: Not inflamed or enlarged. No mass.      Prostate: Normal. Not enlarged, not tender and no nodules present.     Rectum: Guaiac result negative. External hemorrhoid and internal hemorrhoid present. No mass, tenderness or anal fissure. Normal anal tone.  Musculoskeletal:        General: Normal range of motion.     Cervical back: Neck supple.     Right lower leg: No edema.     Left lower leg: No edema.  Lymphadenopathy:     Cervical: No cervical adenopathy.     Lower Body: No right inguinal adenopathy. No left inguinal adenopathy.  Skin:    General: Skin is warm and dry.     Coloration: Skin is not pale.  Neurological:     General: No focal deficit present.     Mental Status: He is alert.  Psychiatric:        Mood and Affect: Mood normal.        Behavior: Behavior normal.     Lab Results  Component Value Date   WBC 9.0 05/11/2020   HGB 15.6 05/11/2020   HCT 45.7 05/11/2020   PLT 259 05/11/2020   GLUCOSE 85 10/10/2020   CHOL 135 05/11/2020   TRIG 160 (H) 05/11/2020   HDL 47 05/11/2020   LDLDIRECT 70.0 02/11/2017   LDLCALC 64 05/11/2020   ALT 28 02/11/2017   AST 24 02/11/2017   NA 133 (L) 10/10/2020   K 3.6 10/10/2020   CL 100 10/10/2020   CREATININE 0.90 10/10/2020   BUN 11 10/10/2020   CO2 23 10/10/2020   TSH 4.82 (H) 10/10/2020   PSA 1.06 05/11/2020   HGBA1C 5.7 02/11/2017    MR BRAIN WO CONTRAST  Result Date: 02/11/2017 CLINICAL DATA:  65 year old male with 10 days of unexplained headache and dizziness. Ataxia. EXAM: MRI HEAD WITHOUT CONTRAST TECHNIQUE: Multiplanar, multiecho pulse sequences of the brain and surrounding structures were obtained without intravenous contrast. COMPARISON:  None. FINDINGS: Brain: Cerebral volume is within normal limits for age. No restricted diffusion to suggest acute infarction. No midline shift, mass effect, evidence of mass lesion, ventriculomegaly, extra-axial collection or acute intracranial hemorrhage. Cervicomedullary junction and pituitary are within normal limits. Wallace Cullens  and white matter signal is within normal limits for age throughout the brain. No cortical encephalomalacia or chronic cerebral blood products. Deep gray matter nuclei, brainstem, and cerebellum appear normal. Vascular: Major intracranial vascular flow voids are preserved. Skull and upper cervical spine: Negative. Normal bone marrow signal. Sinuses/Orbits: Disc conjugate gaze but otherwise negative orbit soft tissues. Paranasal sinuses are clear. Other: Mastoid air cells are clear. Visible internal auditory structures appear normal. Negative scalp soft tissues. IMPRESSION: No acute intracranial abnormality and normal for age noncontrast Normal MRI appearance of the  brain. Electronically Signed   By: Odessa Fleming M.D.   On: 02/11/2017 17:33    Assessment & Plan:   [name] was seen today for hypertension and hyperlipidemia.  Diagnoses and all orders for this visit:  Essential hypertension, benign- His blood pressure is overcontrolled and he has hyponatremia.  I recommended that he stop taking the diuretic and to stay on an ARB. -     Basic metabolic panel; Future -     Magnesium; Future -     irbesartan (AVAPRO) 300 MG tablet; Take 1 tablet (300 mg total) by mouth daily. -     ABO; Future -     ABO -     Magnesium -     Basic metabolic panel  TSH elevation- His TSH is mildly elevated.  His only symptom is weight gain.  This is consistent with subclinical hypothyroidism. -     Thyroid peroxidase antibody; Future -     Thyroid Panel With TSH; Future -     ABO; Future -     ABO -     Thyroid Panel With TSH -     Thyroid peroxidase antibody  Diuretic-induced hypokalemia- His potassium level is normal now. -     Basic metabolic panel; Future -     Magnesium; Future -     ABO; Future -     ABO -     Magnesium -     Basic metabolic panel  Hyperlipidemia with target LDL less than 130 -     rosuvastatin (CRESTOR) 20 MG tablet; Take 1 tablet (20 mg total) by mouth daily. -     ABO; Future -      ABO   I have discontinued Carola Rhine "Sony"'s ciclopirox, Klor-Con M20, HYDROcodone-acetaminophen, and Edarbyclor. I have also changed his rosuvastatin. Additionally, I am having him start on irbesartan. Lastly, I am having him maintain his aspirin and cholecalciferol.  Meds ordered this encounter  Medications  . rosuvastatin (CRESTOR) 20 MG tablet    Sig: Take 1 tablet (20 mg total) by mouth daily.    Dispense:  90 tablet    Refill:  1  . irbesartan (AVAPRO) 300 MG tablet    Sig: Take 1 tablet (300 mg total) by mouth daily.    Dispense:  90 tablet    Refill:  1     Follow-up: Return in about 6 months (around 04/12/2021).  Sanda Linger, MD

## 2020-10-11 LAB — THYROID PANEL WITH TSH
Free Thyroxine Index: 2.1 (ref 1.4–3.8)
T3 Uptake: 29 % (ref 22–35)
T4, Total: 7.1 ug/dL (ref 4.9–10.5)
TSH: 4.82 mIU/L — ABNORMAL HIGH (ref 0.40–4.50)

## 2020-10-11 LAB — ABO

## 2020-10-11 LAB — THYROID PEROXIDASE ANTIBODY: Thyroperoxidase Ab SerPl-aCnc: 1 IU/mL (ref <9)

## 2021-04-02 ENCOUNTER — Other Ambulatory Visit: Payer: Self-pay | Admitting: Internal Medicine

## 2021-04-02 DIAGNOSIS — I10 Essential (primary) hypertension: Secondary | ICD-10-CM

## 2021-04-12 ENCOUNTER — Ambulatory Visit: Payer: 59 | Admitting: Internal Medicine

## 2021-05-10 ENCOUNTER — Encounter: Payer: Self-pay | Admitting: Internal Medicine

## 2021-05-10 ENCOUNTER — Ambulatory Visit: Payer: 59 | Admitting: Internal Medicine

## 2021-05-10 ENCOUNTER — Other Ambulatory Visit: Payer: Self-pay

## 2021-05-10 VITALS — BP 136/86 | HR 83 | Temp 98.6°F | Resp 16 | Ht 63.0 in | Wt 201.0 lb

## 2021-05-10 DIAGNOSIS — E785 Hyperlipidemia, unspecified: Secondary | ICD-10-CM

## 2021-05-10 DIAGNOSIS — R7989 Other specified abnormal findings of blood chemistry: Secondary | ICD-10-CM

## 2021-05-10 DIAGNOSIS — Z23 Encounter for immunization: Secondary | ICD-10-CM

## 2021-05-10 DIAGNOSIS — I10 Essential (primary) hypertension: Secondary | ICD-10-CM

## 2021-05-10 LAB — LIPID PANEL
Cholesterol: 127 mg/dL (ref 0–200)
HDL: 41 mg/dL (ref >39.00)
LDL Cholesterol: 62 mg/dL (ref 0–99)
NonHDL: 85.61
Total CHOL/HDL Ratio: 3
Triglycerides: 116 mg/dL (ref 0.0–149.0)
VLDL: 23.2 mg/dL (ref 0.0–40.0)

## 2021-05-10 LAB — CBC WITH DIFFERENTIAL/PLATELET
Basophils Absolute: 0.1 10*3/uL (ref 0.0–0.1)
Basophils Relative: 1.3 % (ref 0.0–3.0)
Eosinophils Absolute: 0.2 10*3/uL (ref 0.0–0.7)
Eosinophils Relative: 2.5 % (ref 0.0–5.0)
HCT: 49.9 % (ref 39.0–52.0)
Hemoglobin: 16.5 g/dL (ref 13.0–17.0)
Lymphocytes Relative: 18 % (ref 12.0–46.0)
Lymphs Abs: 1.4 10*3/uL (ref 0.7–4.0)
MCHC: 33.1 g/dL (ref 30.0–36.0)
MCV: 89.2 fl (ref 78.0–100.0)
Monocytes Absolute: 0.7 10*3/uL (ref 0.1–1.0)
Monocytes Relative: 9.4 % (ref 3.0–12.0)
Neutro Abs: 5.4 10*3/uL (ref 1.4–7.7)
Neutrophils Relative %: 68.8 % (ref 43.0–77.0)
Platelets: 250 10*3/uL (ref 150.0–400.0)
RBC: 5.59 Mil/uL (ref 4.22–5.81)
RDW: 13.8 % (ref 11.5–15.5)
WBC: 7.8 10*3/uL (ref 4.0–10.5)

## 2021-05-10 LAB — HEPATIC FUNCTION PANEL
ALT: 19 U/L (ref 0–53)
AST: 21 U/L (ref 0–37)
Albumin: 4.3 g/dL (ref 3.5–5.2)
Alkaline Phosphatase: 83 U/L (ref 39–117)
Bilirubin, Direct: 0.1 mg/dL (ref 0.0–0.3)
Total Bilirubin: 0.9 mg/dL (ref 0.2–1.2)
Total Protein: 7.2 g/dL (ref 6.0–8.3)

## 2021-05-10 LAB — TSH: TSH: 5.21 u[IU]/mL (ref 0.35–5.50)

## 2021-05-10 LAB — BASIC METABOLIC PANEL
BUN: 8 mg/dL (ref 6–23)
CO2: 25 mEq/L (ref 19–32)
Calcium: 9.3 mg/dL (ref 8.4–10.5)
Chloride: 102 mEq/L (ref 96–112)
Creatinine, Ser: 0.93 mg/dL (ref 0.40–1.50)
GFR: 85.99 mL/min (ref >60.00)
Glucose, Bld: 88 mg/dL (ref 70–99)
Potassium: 4.1 mEq/L (ref 3.5–5.1)
Sodium: 136 mEq/L (ref 135–145)

## 2021-05-10 NOTE — Patient Instructions (Signed)

## 2021-05-10 NOTE — Progress Notes (Signed)
Subjective:  Patient ID: Blake Anderson, male    DOB: [birth year]  Age: 65 y.o. MRN: [id number]  CC: Hypertension and Hyperlipidemia  This visit occurred during the SARS-CoV-2 public health emergency.  Safety protocols were in place, including screening questions prior to the visit, additional usage of staff PPE, and extensive cleaning of exam room while observing appropriate contact time as indicated for disinfecting solutions.     HPI Blake Anderson presents for f/up -   He has lost weight with lifestyle modifications.  He walks about 20 minutes a day and does not experience CP, DOE, diaphoresis, dizziness, lightheadedness, or edema.  Outpatient Medications Prior to Visit  Medication Sig Dispense Refill   aspirin 81 MG tablet Take 81 mg by mouth daily.     cholecalciferol (VITAMIN D3) 25 MCG (1000 UT) tablet Take 1,000 Units by mouth daily.     irbesartan (AVAPRO) 300 MG tablet TAKE 1 TABLET BY MOUTH EVERY DAY 90 tablet 0   rosuvastatin (CRESTOR) 20 MG tablet Take 1 tablet (20 mg total) by mouth daily. 90 tablet 1   No facility-administered medications prior to visit.    ROS Review of Systems  Constitutional:  Negative for diaphoresis, fatigue and unexpected weight change.  HENT: Negative.    Respiratory:  Negative for cough, chest tightness, shortness of breath and wheezing.   Cardiovascular:  Negative for chest pain, palpitations and leg swelling.  Gastrointestinal:  Negative for abdominal pain, constipation, diarrhea, nausea and vomiting.  Endocrine: Negative for cold intolerance and heat intolerance.  Genitourinary: Negative.  Negative for difficulty urinating.  Musculoskeletal:  Negative for arthralgias and myalgias.  Skin: Negative.  Negative for color change.  Neurological:  Negative for dizziness, weakness, light-headedness and headaches.  Hematological:  Negative for adenopathy. Does not bruise/bleed easily.  Psychiatric/Behavioral: Negative.     Objective:  BP 136/86  (BP Location: Left Arm, Patient Position: Sitting, Cuff Size: Large)   Pulse 83   Temp 98.6 F (37 C) (Oral)   Resp 16   Ht 5\' 3"  (1.6 m)   Wt 201 lb (91.2 kg)   SpO2 96%   BMI 35.61 kg/m   BP Readings from Last 3 Encounters:  05/10/21 136/86  10/10/20 116/78  05/11/20 118/70    Wt Readings from Last 3 Encounters:  05/10/21 201 lb (91.2 kg)  10/10/20 218 lb (98.9 kg)  05/11/20 211 lb 9.6 oz (96 kg)    Physical Exam Vitals reviewed.  HENT:     Nose: Nose normal.     Mouth/Throat:     Mouth: Mucous membranes are moist.  Eyes:     Conjunctiva/sclera: Conjunctivae normal.  Cardiovascular:     Rate and Rhythm: Normal rate and regular rhythm.     Heart sounds: No murmur heard. Pulmonary:     Effort: Pulmonary effort is normal.     Breath sounds: No stridor. No wheezing, rhonchi or rales.  Abdominal:     General: Abdomen is protuberant. Bowel sounds are normal. There is no distension.     Palpations: Abdomen is soft. There is no hepatomegaly, splenomegaly or mass.     Tenderness: There is no guarding.  Musculoskeletal:        General: Normal range of motion.     Cervical back: Neck supple.  Lymphadenopathy:     Cervical: No cervical adenopathy.  Skin:    General: Skin is warm and dry.  Neurological:     General: No focal deficit present.     Mental  Status: He is alert.  Psychiatric:        Mood and Affect: Mood normal.        Behavior: Behavior normal.    Lab Results  Component Value Date   WBC 7.8 05/10/2021   HGB 16.5 05/10/2021   HCT 49.9 05/10/2021   PLT 250.0 05/10/2021   GLUCOSE 88 05/10/2021   CHOL 127 05/10/2021   TRIG 116.0 05/10/2021   HDL 41.00 05/10/2021   LDLDIRECT 70.0 02/11/2017   LDLCALC 62 05/10/2021   ALT 19 05/10/2021   AST 21 05/10/2021   NA 136 05/10/2021   K 4.1 05/10/2021   CL 102 05/10/2021   CREATININE 0.93 05/10/2021   BUN 8 05/10/2021   CO2 25 05/10/2021   TSH 5.21 05/10/2021   PSA 1.06 05/11/2020   HGBA1C 5.7  02/11/2017    MR BRAIN WO CONTRAST  Result Date: 02/11/2017 CLINICAL DATA:  65 year old male with 10 days of unexplained headache and dizziness. Ataxia. EXAM: MRI HEAD WITHOUT CONTRAST TECHNIQUE: Multiplanar, multiecho pulse sequences of the brain and surrounding structures were obtained without intravenous contrast. COMPARISON:  None. FINDINGS: Brain: Cerebral volume is within normal limits for age. No restricted diffusion to suggest acute infarction. No midline shift, mass effect, evidence of mass lesion, ventriculomegaly, extra-axial collection or acute intracranial hemorrhage. Cervicomedullary junction and pituitary are within normal limits. Wallace Cullens and white matter signal is within normal limits for age throughout the brain. No cortical encephalomalacia or chronic cerebral blood products. Deep gray matter nuclei, brainstem, and cerebellum appear normal. Vascular: Major intracranial vascular flow voids are preserved. Skull and upper cervical spine: Negative. Normal bone marrow signal. Sinuses/Orbits: Disc conjugate gaze but otherwise negative orbit soft tissues. Paranasal sinuses are clear. Other: Mastoid air cells are clear. Visible internal auditory structures appear normal. Negative scalp soft tissues. IMPRESSION: No acute intracranial abnormality and normal for age noncontrast Normal MRI appearance of the brain. Electronically Signed   By: Odessa Fleming M.D.   On: 02/11/2017 17:33    Assessment & Plan:   Blake Anderson was seen today for hypertension and hyperlipidemia.  Diagnoses and all orders for this visit:  Flu vaccine need -     Flu Vaccine QUAD High Dose(Fluad)  Essential hypertension, benign- He has not achieved his blood pressure goal of 130/80.  Will continue the current dose of the ARB and he will continue working on his lifestyle modifications. -     CBC with Differential/Platelet; Future -     Basic metabolic panel; Future -     Basic metabolic panel -     CBC with Differential/Platelet  TSH  elevation- His TSH is normal now and clinically he is euthyroid. -     TSH; Future -     TSH  Hyperlipidemia with target LDL less than 130- LDL goal achieved. Doing well on the statin  -     Lipid panel; Future -     TSH; Future -     Hepatic function panel; Future -     Hepatic function panel -     TSH -     Lipid panel  Need for vaccination -     Pneumococcal conjugate vaccine 20-valent  I am having Blake Baney "Kijuan" maintain his aspirin, cholecalciferol, rosuvastatin, and irbesartan.  No orders of the defined types were placed in this encounter.    Follow-up: Return in about 3 months (around 08/09/2021).  Sanda Linger, MD

## 2021-05-18 ENCOUNTER — Other Ambulatory Visit: Payer: Self-pay | Admitting: Internal Medicine

## 2021-05-18 DIAGNOSIS — E785 Hyperlipidemia, unspecified: Secondary | ICD-10-CM

## 2021-06-26 ENCOUNTER — Other Ambulatory Visit: Payer: Self-pay | Admitting: Internal Medicine

## 2021-06-26 DIAGNOSIS — I10 Essential (primary) hypertension: Secondary | ICD-10-CM

## 2021-07-26 ENCOUNTER — Other Ambulatory Visit: Payer: Self-pay | Admitting: Internal Medicine

## 2021-07-26 DIAGNOSIS — I10 Essential (primary) hypertension: Secondary | ICD-10-CM

## 2021-08-16 ENCOUNTER — Ambulatory Visit: Payer: 59 | Admitting: Internal Medicine

## 2021-10-24 ENCOUNTER — Ambulatory Visit: Payer: 59 | Admitting: Internal Medicine

## 2021-10-29 ENCOUNTER — Other Ambulatory Visit: Payer: Self-pay | Admitting: Internal Medicine

## 2021-10-29 DIAGNOSIS — E785 Hyperlipidemia, unspecified: Secondary | ICD-10-CM

## 2021-12-23 ENCOUNTER — Other Ambulatory Visit: Payer: Self-pay | Admitting: Internal Medicine

## 2021-12-23 DIAGNOSIS — I10 Essential (primary) hypertension: Secondary | ICD-10-CM

## 2022-02-06 ENCOUNTER — Ambulatory Visit (INDEPENDENT_AMBULATORY_CARE_PROVIDER_SITE_OTHER): Payer: Medicare Other | Admitting: Internal Medicine

## 2022-02-06 ENCOUNTER — Encounter: Payer: Self-pay | Admitting: Internal Medicine

## 2022-02-06 VITALS — BP 122/80 | HR 88 | Temp 97.8°F | Ht 63.0 in | Wt 216.0 lb

## 2022-02-06 DIAGNOSIS — I10 Essential (primary) hypertension: Secondary | ICD-10-CM | POA: Diagnosis not present

## 2022-02-06 DIAGNOSIS — R7989 Other specified abnormal findings of blood chemistry: Secondary | ICD-10-CM

## 2022-02-06 DIAGNOSIS — E785 Hyperlipidemia, unspecified: Secondary | ICD-10-CM

## 2022-02-06 DIAGNOSIS — R9431 Abnormal electrocardiogram [ECG] [EKG]: Secondary | ICD-10-CM | POA: Insufficient documentation

## 2022-02-06 DIAGNOSIS — L219 Seborrheic dermatitis, unspecified: Secondary | ICD-10-CM | POA: Diagnosis not present

## 2022-02-06 DIAGNOSIS — Z0000 Encounter for general adult medical examination without abnormal findings: Secondary | ICD-10-CM | POA: Diagnosis not present

## 2022-02-06 DIAGNOSIS — N400 Enlarged prostate without lower urinary tract symptoms: Secondary | ICD-10-CM

## 2022-02-06 DIAGNOSIS — Z23 Encounter for immunization: Secondary | ICD-10-CM

## 2022-02-06 LAB — CBC WITH DIFFERENTIAL/PLATELET
Basophils Absolute: 0.1 10*3/uL (ref 0.0–0.1)
Basophils Relative: 1.3 % (ref 0.0–3.0)
Eosinophils Absolute: 0.2 10*3/uL (ref 0.0–0.7)
Eosinophils Relative: 2.8 % (ref 0.0–5.0)
HCT: 49 % (ref 39.0–52.0)
Hemoglobin: 16.6 g/dL (ref 13.0–17.0)
Lymphocytes Relative: 19 % (ref 12.0–46.0)
Lymphs Abs: 1.5 10*3/uL (ref 0.7–4.0)
MCHC: 33.8 g/dL (ref 30.0–36.0)
MCV: 89.3 fl (ref 78.0–100.0)
Monocytes Absolute: 0.8 10*3/uL (ref 0.1–1.0)
Monocytes Relative: 10.7 % (ref 3.0–12.0)
Neutro Abs: 5.1 10*3/uL (ref 1.4–7.7)
Neutrophils Relative %: 66.2 % (ref 43.0–77.0)
Platelets: 231 10*3/uL (ref 150.0–400.0)
RBC: 5.49 Mil/uL (ref 4.22–5.81)
RDW: 13.3 % (ref 11.5–15.5)
WBC: 7.7 10*3/uL (ref 4.0–10.5)

## 2022-02-06 LAB — BASIC METABOLIC PANEL
BUN: 10 mg/dL (ref 6–23)
CO2: 22 mEq/L (ref 19–32)
Calcium: 9.4 mg/dL (ref 8.4–10.5)
Chloride: 103 mEq/L (ref 96–112)
Creatinine, Ser: 0.96 mg/dL (ref 0.40–1.50)
GFR: 82.34 mL/min (ref >60.00)
Glucose, Bld: 89 mg/dL (ref 70–99)
Potassium: 4.3 mEq/L (ref 3.5–5.1)
Sodium: 136 mEq/L (ref 135–145)

## 2022-02-06 LAB — PSA: PSA: 0.57 ng/mL (ref 0.10–4.00)

## 2022-02-06 MED ORDER — ROSUVASTATIN CALCIUM 20 MG PO TABS: 20.0000 mg | ORAL_TABLET | Freq: Every day | ORAL | 1 refills | Status: DC

## 2022-02-06 MED ORDER — BOOSTRIX 5-2.5-18.5 LF-MCG/0.5 IM SUSP: 0.5000 mL | Freq: Once | INTRAMUSCULAR | 0 refills | Status: AC

## 2022-02-06 MED ORDER — KETOCONAZOLE 2 % EX CREA: 1.0000 | TOPICAL_CREAM | Freq: Two times a day (BID) | CUTANEOUS | 3 refills | Status: DC

## 2022-02-06 NOTE — Progress Notes (Signed)
Subjective:  Patient ID: Blake Anderson, male    DOB: May 19, 1956  Age: 66 y.o. MRN: [id number]  CC: Annual Exam, Rash, Hypertension, and Hyperlipidemia   HPI Blake Anderson presents for a CPX and f/up -  Over the last 6 months he has developed dyspnea on exertion.  He has retired and is not very active.  He thinks the DOE is related to his sedentary lifestyle and weight gain.  He denies chest pain, diaphoresis, dizziness, lightheadedness, or edema.  Outpatient Medications Prior to Visit  Medication Sig Dispense Refill  . aspirin 81 MG tablet Take 81 mg by mouth daily.    . cholecalciferol (VITAMIN D3) 25 MCG (1000 UT) tablet Take 1,000 Units by mouth daily.    . irbesartan (AVAPRO) 300 MG tablet TAKE 1 TABLET BY MOUTH EVERY DAY 90 tablet 0  . rosuvastatin (CRESTOR) 20 MG tablet TAKE 1 TABLET BY MOUTH EVERY DAY 90 tablet 1   No facility-administered medications prior to visit.    ROS Review of Systems  Constitutional:  Positive for unexpected weight change. Negative for chills, diaphoresis and fatigue.  Respiratory:  Negative for cough, chest tightness, shortness of breath and wheezing.   Cardiovascular:  Negative for chest pain, palpitations and leg swelling.  Gastrointestinal:  Negative for abdominal pain, constipation, diarrhea and vomiting.  Genitourinary: Negative.  Negative for difficulty urinating.  Musculoskeletal: Negative.   Skin:  Positive for color change and rash.  Neurological: Negative.  Negative for dizziness and weakness.  Hematological:  Negative for adenopathy. Does not bruise/bleed easily.  Psychiatric/Behavioral: Negative.      Objective:  BP 122/80 (BP Location: Right Arm, Patient Position: Sitting, Cuff Size: Large)   Pulse 88   Temp 97.8 F (36.6 C) (Oral)   Ht 5\' 3"  (1.6 m)   Wt 216 lb (98 kg)   SpO2 95%   BMI 38.26 kg/m   BP Readings from Last 3 Encounters:  02/06/22 122/80  05/10/21 136/86  10/10/20 116/78    Wt Readings from Last 3  Encounters:  02/06/22 216 lb (98 kg)  05/10/21 201 lb (91.2 kg)  10/10/20 218 lb (98.9 kg)    Physical Exam Vitals reviewed.  Constitutional:      Appearance: He is obese. He is not ill-appearing.  HENT:     Nose: Nose normal.     Mouth/Throat:     Mouth: Mucous membranes are moist.  Eyes:     General: No scleral icterus.    Conjunctiva/sclera: Conjunctivae normal.  Cardiovascular:     Rate and Rhythm: Normal rate and regular rhythm.     Heart sounds: No murmur heard.    Comments: EKG- NSR, 74 bpm Flat T waves laterally No LVH or Q waves Pulmonary:     Breath sounds: No stridor. No wheezing, rhonchi or rales.  Abdominal:     General: Abdomen is protuberant. Bowel sounds are normal. There is no distension.     Palpations: Abdomen is soft. There is no hepatomegaly, splenomegaly or mass.     Tenderness: There is no abdominal tenderness.     Hernia: No hernia is present. There is no hernia in the left inguinal area or right inguinal area.  Genitourinary:    Pubic Area: No rash.      Penis: Normal and circumcised.      Testes: Normal.     Epididymis:     Right: Normal.     Left: Normal.     Prostate: Enlarged. Not  tender and no nodules present.     Rectum: Normal. Guaiac result negative. No mass, tenderness, anal fissure, external hemorrhoid or internal hemorrhoid. Normal anal tone.  Musculoskeletal:        General: Normal range of motion.     Cervical back: Neck supple.     Right lower leg: No edema.     Left lower leg: No edema.  Lymphadenopathy:     Cervical: No cervical adenopathy.     Lower Body: No right inguinal adenopathy. No left inguinal adenopathy.  Skin:    General: Skin is warm and dry.     Coloration: Skin is not jaundiced or pale.     Findings: Erythema and rash present. No bruising or lesion. Rash is scaling.  Neurological:     General: No focal deficit present.     Mental Status: He is alert. Mental status is at baseline.  Psychiatric:        Mood  and Affect: Mood normal.        Behavior: Behavior normal.        Thought Content: Thought content normal.        Judgment: Judgment normal.    Lab Results  Component Value Date   WBC 7.7 02/06/2022   HGB 16.6 02/06/2022   HCT 49.0 02/06/2022   PLT 231.0 02/06/2022   GLUCOSE 89 02/06/2022   CHOL 127 05/10/2021   TRIG 116.0 05/10/2021   HDL 41.00 05/10/2021   LDLDIRECT 70.0 02/11/2017   LDLCALC 62 05/10/2021   ALT 19 05/10/2021   AST 21 05/10/2021   NA 136 02/06/2022   K 4.3 02/06/2022   CL 103 02/06/2022   CREATININE 0.96 02/06/2022   BUN 10 02/06/2022   CO2 22 02/06/2022   TSH 5.21 05/10/2021   PSA 0.57 02/06/2022   HGBA1C 5.7 02/11/2017    MR BRAIN WO CONTRAST  Result Date: 02/11/2017 CLINICAL DATA:  66 year old male with 10 days of unexplained headache and dizziness. Ataxia. EXAM: MRI HEAD WITHOUT CONTRAST TECHNIQUE: Multiplanar, multiecho pulse sequences of the brain and surrounding structures were obtained without intravenous contrast. COMPARISON:  None. FINDINGS: Brain: Cerebral volume is within normal limits for age. No restricted diffusion to suggest acute infarction. No midline shift, mass effect, evidence of mass lesion, ventriculomegaly, extra-axial collection or acute intracranial hemorrhage. Cervicomedullary junction and pituitary are within normal limits. Wallace Cullens and white matter signal is within normal limits for age throughout the brain. No cortical encephalomalacia or chronic cerebral blood products. Deep gray matter nuclei, brainstem, and cerebellum appear normal. Vascular: Major intracranial vascular flow voids are preserved. Skull and upper cervical spine: Negative. Normal bone marrow signal. Sinuses/Orbits: Disc conjugate gaze but otherwise negative orbit soft tissues. Paranasal sinuses are clear. Other: Mastoid air cells are clear. Visible internal auditory structures appear normal. Negative scalp soft tissues. IMPRESSION: No acute intracranial abnormality and  normal for age noncontrast Normal MRI appearance of the brain. Electronically Signed   By: Odessa Fleming M.D.   On: 02/11/2017 17:33    Assessment & Plan:   [name] was seen today for annual exam, rash, hypertension and hyperlipidemia.  Diagnoses and all orders for this visit:  Essential hypertension, benign -     Thyroid Panel With TSH; Future -     Thyroid peroxidase antibody; Future -     Basic metabolic panel; Future -     CBC with Differential/Platelet; Future -     EKG 12-Lead -     CBC with Differential/Platelet -  Basic metabolic panel -     Thyroid peroxidase antibody -     Thyroid Panel With TSH  Hyperlipidemia with target LDL less than 130 -     rosuvastatin (CRESTOR) 20 MG tablet; Take 1 tablet (20 mg total) by mouth daily.  Routine general medical examination at a health care facility  TSH elevation -     Thyroid Panel With TSH; Future -     Thyroid peroxidase antibody; Future -     Thyroid peroxidase antibody -     Thyroid Panel With TSH  Chronic seborrheic dermatitis -     ketoconazole (NIZORAL) 2 % cream; Apply 1 Application topically 2 (two) times daily.  Abnormal electrocardiogram -     CT CARDIAC SCORING (SELF PAY ONLY); Future  Benign prostatic hyperplasia without lower urinary tract symptoms -     PSA; Future -     PSA  Need for prophylactic vaccination with combined diphtheria-tetanus-pertussis (DTP) vaccine -     Tdap (BOOSTRIX) 5-2.5-18.5 LF-MCG/0.5 injection; Inject 0.5 mLs into the muscle once for 1 dose.   I have changed Carola Rhine "Derran"'s rosuvastatin. I am also having him start on ketoconazole and Boostrix. Additionally, I am having him maintain his aspirin, cholecalciferol, and irbesartan.  Meds ordered this encounter  Medications  . rosuvastatin (CRESTOR) 20 MG tablet    Sig: Take 1 tablet (20 mg total) by mouth daily.    Dispense:  90 tablet    Refill:  1  . ketoconazole (NIZORAL) 2 % cream    Sig: Apply 1 Application topically 2  (two) times daily.    Dispense:  60 g    Refill:  3  . Tdap (BOOSTRIX) 5-2.5-18.5 LF-MCG/0.5 injection    Sig: Inject 0.5 mLs into the muscle once for 1 dose.    Dispense:  0.5 mL    Refill:  0     Follow-up: Return in about 6 months (around 08/08/2022).  Sanda Linger, MD

## 2022-02-06 NOTE — Patient Instructions (Signed)
Health Maintenance, Male Adopting a healthy lifestyle and getting preventive care are important in promoting health and wellness. Ask your health care provider about: The right schedule for you to have regular tests and exams. Things you can do on your own to prevent diseases and keep yourself healthy. What should I know about diet, weight, and exercise? Eat a healthy diet  Eat a diet that includes plenty of vegetables, fruits, low-fat dairy products, and lean protein. Do not eat a lot of foods that are high in solid fats, added sugars, or sodium. Maintain a healthy weight Body mass index (BMI) is a measurement that can be used to identify possible weight problems. It estimates body fat based on height and weight. Your health care provider can help determine your BMI and help you achieve or maintain a healthy weight. Get regular exercise Get regular exercise. This is one of the most important things you can do for your health. Most adults should: Exercise for at least 150 minutes each week. The exercise should increase your heart rate and make you sweat (moderate-intensity exercise). Do strengthening exercises at least twice a week. This is in addition to the moderate-intensity exercise. Spend less time sitting. Even light physical activity can be beneficial. Watch cholesterol and blood lipids Have your blood tested for lipids and cholesterol at 67 years of age, then have this test every 5 years. You may need to have your cholesterol levels checked more often if: Your lipid or cholesterol levels are high. You are older than 67 years of age. You are at high risk for heart disease. What should I know about cancer screening? Many types of cancers can be detected early and may often be prevented. Depending on your health history and family history, you may need to have cancer screening at various ages. This may include screening for: Colorectal cancer. Prostate cancer. Skin cancer. Lung  cancer. What should I know about heart disease, diabetes, and high blood pressure? Blood pressure and heart disease High blood pressure causes heart disease and increases the risk of stroke. This is more likely to develop in people who have high blood pressure readings or are overweight. Talk with your health care provider about your target blood pressure readings. Have your blood pressure checked: Every 3-5 years if you are 18-39 years of age. Every year if you are 40 years old or older. If you are between the ages of 65 and 75 and are a current or former smoker, ask your health care provider if you should have a one-time screening for abdominal aortic aneurysm (AAA). Diabetes Have regular diabetes screenings. This checks your fasting blood sugar level. Have the screening done: Once every three years after age 45 if you are at a normal weight and have a low risk for diabetes. More often and at a younger age if you are overweight or have a high risk for diabetes. What should I know about preventing infection? Hepatitis B If you have a higher risk for hepatitis B, you should be screened for this virus. Talk with your health care provider to find out if you are at risk for hepatitis B infection. Hepatitis C Blood testing is recommended for: Everyone born from 1945 through 1965. Anyone with known risk factors for hepatitis C. Sexually transmitted infections (STIs) You should be screened each year for STIs, including gonorrhea and chlamydia, if: You are sexually active and are younger than 67 years of age. You are older than 67 years of age and your   health care provider tells you that you are at risk for this type of infection. Your sexual activity has changed since you were last screened, and you are at increased risk for chlamydia or gonorrhea. Ask your health care provider if you are at risk. Ask your health care provider about whether you are at high risk for HIV. Your health care provider  may recommend a prescription medicine to help prevent HIV infection. If you choose to take medicine to prevent HIV, you should first get tested for HIV. You should then be tested every 3 months for as long as you are taking the medicine. Follow these instructions at home: Alcohol use Do not drink alcohol if your health care provider tells you not to drink. If you drink alcohol: Limit how much you have to 0-2 drinks a day. Know how much alcohol is in your drink. In the [place] one drink equals one 12 oz bottle of beer (355 mL), one 5 oz glass of wine (148 mL), or one 1 oz glass of hard liquor (44 mL). Lifestyle Do not use any products that contain nicotine or tobacco. These products include cigarettes, chewing tobacco, and vaping devices, such as e-cigarettes. If you need help quitting, ask your health care provider. Do not use street drugs. Do not share needles. Ask your health care provider for help if you need support or information about quitting drugs. General instructions Schedule regular health, dental, and eye exams. Stay current with your vaccines. Tell your health care provider if: You often feel depressed. You have ever been abused or do not feel safe at home. Summary Adopting a healthy lifestyle and getting preventive care are important in promoting health and wellness. Follow your health care provider's instructions about healthy diet, exercising, and getting tested or screened for diseases. Follow your health care provider's instructions on monitoring your cholesterol and blood pressure. This information is not intended to replace advice given to you by your health care provider. Make sure you discuss any questions you have with your health care provider. Document Revised: 12/18/2020 Document Reviewed: 12/18/2020 Elsevier Patient Education  2023 Elsevier Inc.  

## 2022-02-08 LAB — THYROID PEROXIDASE ANTIBODY: Thyroperoxidase Ab SerPl-aCnc: <1 IU/mL (ref <9)

## 2022-02-08 LAB — THYROID PANEL WITH TSH
Free Thyroxine Index: 2.3 (ref 1.4–3.8)
T3 Uptake: 28 % (ref 22–35)
T4, Total: 8.2 ug/dL (ref 4.9–10.5)
TSH: 4.23 mIU/L (ref 0.40–4.50)

## 2022-02-22 ENCOUNTER — Ambulatory Visit (HOSPITAL_BASED_OUTPATIENT_CLINIC_OR_DEPARTMENT_OTHER)
Admission: RE | Admit: 2022-02-22 | Discharge: 2022-02-22 | Disposition: A | Discharge status: Home / Self Care | Payer: Commercial Managed Care - PPO | Source: Ambulatory Visit | Attending: Internal Medicine | Admitting: Internal Medicine

## 2022-02-22 DIAGNOSIS — R9431 Abnormal electrocardiogram [ECG] [EKG]: Secondary | ICD-10-CM | POA: Insufficient documentation

## 2022-02-23 ENCOUNTER — Other Ambulatory Visit: Payer: Self-pay | Admitting: Internal Medicine

## 2022-02-23 DIAGNOSIS — R931 Abnormal findings on diagnostic imaging of heart and coronary circulation: Secondary | ICD-10-CM

## 2022-03-13 NOTE — Progress Notes (Unsigned)
Cardiology Office Note:   Date:  03/14/2022  NAME:  Blake Anderson    MRN: [id number] DOB:  28-Dec-1955   PCP:  Blake Grandchild, MD  Cardiologist:  None  Electrophysiologist:  None   Referring MD: Blake Grandchild, MD   Chief Complaint  Patient presents with   Chest Pain   Shortness of Breath   History of Present Illness:   Blake Anderson is a 66 y.o. male with a hx of HTN, HLD, CAD who is being seen today for the evaluation of CAD at the request of Blake Grandchild, MD. recently had coronary calcium scoring which was elevated.  LDL cholesterol is at goal.  He reports he retired in December.  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  He worked his way up the [employer].  Reports retirement was sudden.  He did not do well with this.  Now he is taking better care of himself.  He reports he does get occasional tightness in his chest.  Can occur while sitting.  Can also occur with activity.  He also gets short of breath with activity which she attributes to his weight.  BMI 38.  He does have hypertension.  This is well controlled.  He is on an aspirin which he should continue.  He is on Crestor his LDL is at goal.  CV exam is normal.  EKG demonstrates sinus rhythm with nonspecific ST-T changes.  There is no strong family history of heart disease.  He has been on Crestor for 10 years.  He is never married.  No children.  He reports his meals have been consumed by microwave dinners.  He is going to work on this.  He is going to start to eat healthy and fresh.  No activity reported.  He plans to start this soon.  Problem List CAD -CAC 1133 (92nd percentile) 2. HLD -T chol 127, HDL 41, LDL 62, TG 116 3. HTN  Past Medical History: Past Medical History:  Diagnosis Date   GERD (gastroesophageal reflux disease)    Hyperlipidemia    Hypertension     Past Surgical History: History reviewed. No pertinent surgical history.  Current Medications: Current Meds  Medication Sig    aspirin 81 MG tablet Take 81 mg by mouth daily.   cholecalciferol (VITAMIN D3) 25 MCG (1000 UT) tablet Take 1,000 Units by mouth daily.   irbesartan (AVAPRO) 300 MG tablet TAKE 1 TABLET BY MOUTH EVERY DAY   ketoconazole (NIZORAL) 2 % cream Apply 1 Application topically 2 (two) times daily.   metoprolol tartrate (LOPRESSOR) 100 MG tablet Take 1 tablet by mouth once for procedure.   rosuvastatin (CRESTOR) 20 MG tablet Take 1 tablet (20 mg total) by mouth daily.     Allergies:    Lisinopril   Social History: Social History   Socioeconomic History   Marital status: Single    Spouse name: Not on file   Number of children: Not on file   Years of education: Not on file   Highest education level: Not on file  Occupational History   Occupation: Retired  Tobacco Use   Smoking status: Never   Smokeless tobacco: Never  Substance and Sexual Activity   Alcohol use: No   Drug use: No   Sexual activity: Not Currently  Other Topics Concern   Not on file  Social History Narrative   Not on file   Social Determinants of Health   Financial Resource Strain: Not  on file  Food Insecurity: Not on file  Transportation Needs: Not on file  Physical Activity: Not on file  Stress: Not on file  Social Connections: Not on file     Family History: The patient's family history includes Alcohol abuse in his father; Arthritis in his mother. There is no history of Asthma, Cancer, COPD, Diabetes, Heart disease, Hyperlipidemia, Hypertension, Kidney disease, or Stroke.  ROS:   All other ROS reviewed and negative. Pertinent positives noted in the HPI.     EKGs/Labs/Other Studies Reviewed:   The following studies were personally reviewed by me today:  EKG:  EKG is ordered today.  The ekg ordered today demonstrates normal sinus rhythm heart rate 93, nonspecific ST-T changes, and was personally reviewed by me.   CAC 02/22/2022 IMPRESSION: Coronary calcium score of 1133 Agatston units. This was  92nd percentile for age-, race-, and sex-matched controls.  Recent Labs: 05/10/2021: ALT 19 02/06/2022: BUN 10; Creatinine, Ser 0.96; Hemoglobin 16.6; Platelets 231.0; Potassium 4.3; Sodium 136; TSH 4.23   Recent Lipid Panel    Component Value Date/Time   CHOL 127 05/10/2021 1327   TRIG 116.0 05/10/2021 1327   HDL 41.00 05/10/2021 1327   CHOLHDL 3 05/10/2021 1327   VLDL 23.2 05/10/2021 1327   LDLCALC 62 05/10/2021 1327   LDLCALC 64 05/11/2020 0200   LDLDIRECT 70.0 02/11/2017 1005    Physical Exam:   VS:  BP (!) 124/100 (BP Location: Left Arm, Patient Position: Sitting, Cuff Size: Large)   Pulse 93   Ht 5\' 3"  (1.6 m)   Wt 217 lb (98.4 kg)   BMI 38.44 kg/m    Wt Readings from Last 3 Encounters:  03/14/22 217 lb (98.4 kg)  02/06/22 216 lb (98 kg)  05/10/21 201 lb (91.2 kg)    General: Well nourished, well developed, in no acute distress Head: Atraumatic, normal size  Eyes: PEERLA, EOMI  Neck: Supple, no JVD Endocrine: No thryomegaly Cardiac: Normal S1, S2; RRR; no murmurs, rubs, or gallops Lungs: Clear to auscultation bilaterally, no wheezing, rhonchi or rales  Abd: Soft, nontender, no hepatomegaly  Ext: No edema, pulses 2+ Musculoskeletal: No deformities, BUE and BLE strength normal and equal Skin: Warm and dry, no rashes   Neuro: Alert and oriented to person, place, time, and situation, CNII-XII grossly intact, no focal deficits  Psych: Normal mood and affect   ASSESSMENT:   Blake Anderson is a 66 y.o. male who presents for the following: 1. Coronary artery disease of native artery of native heart with stable angina pectoris (HCC)   2. Agatston CAC score, >400   3. Hyperlipidemia with target LDL less than 130   4. Primary hypertension   5. Obesity (BMI 30-39.9)    PLAN:   1. Coronary artery disease of native artery of native heart with stable angina pectoris (HCC) 2. Agatston CAC score, >400 3. Hyperlipidemia with target LDL less than 130 -Elevated coronary  calcium score.  Reporting chest tightness.  I looked at his calcium score.  I think the calcium is spread out.  I believe he is a good candidate for coronary CTA.  I would like to make sure he does not have any significant stenoses.  We will also check an echo.  I believe this is a better test than nuclear medicine stress test.  Cardiac PET will be 3 to 4 months out.  For now have recommended regular exercise and proper diet.  He will work on this.  His blood pressure is  well controlled.  His lipids are well controlled.  Further titration of medications based on coronary CTA.  4. Primary hypertension -Well-controlled on current medications.  5. Obesity (BMI 30-39.9) -Weight loss recommended.  Disposition: Return in about 1 year (around 03/15/2023).  Medication Adjustments/Labs and Tests Ordered: Current medicines are reviewed at length with the patient today.  Concerns regarding medicines are outlined above.  Orders Placed This Encounter  Procedures   CT CORONARY MORPH W/CTA COR W/SCORE W/CA W/CM &/OR WO/CM   Basic metabolic panel   EKG 12-Lead   ECHOCARDIOGRAM COMPLETE   Meds ordered this encounter  Medications   metoprolol tartrate (LOPRESSOR) 100 MG tablet    Sig: Take 1 tablet by mouth once for procedure.    Dispense:  1 tablet    Refill:  0    Patient Instructions  Medication Instructions:  TAKE Metoprolol 100 mg two hours before CT when scheduled.   *If you need a refill on your cardiac medications before your next appointment, please call your pharmacy*   Lab Work: BMET today  If you have labs (blood work) drawn today and your tests are completely normal, you will receive your results only by: MyChart Message (if you have MyChart) OR A paper copy in the mail If you have any lab test that is abnormal or we need to change your treatment, we will call you to review the results.   Testing/Procedures: Echocardiogram - Your physician has requested that you have an  echocardiogram. Echocardiography is a painless test that uses sound waves to create images of your heart. It provides your doctor with information about the size and shape of your heart and how well your heart's chambers and valves are working. This procedure takes approximately one hour. There are no restrictions for this procedure.   Your physician has requested that you have cardiac CT. Cardiac computed tomography (CT) is a painless test that uses an x-ray machine to take clear, detailed pictures of your heart. For further information please visit [email or web address] Please follow instruction sheet as given.   Follow-Up: At [facility], you and your health needs are our priority.  As part of our continuing mission to provide you with exceptional heart care, we have created designated Provider Care Teams.  These Care Teams include your primary Cardiologist (physician) and Advanced Practice Providers (APPs -  Physician Assistants and Nurse Practitioners) who all work together to provide you with the care you need, when you need it.  We recommend signing up for the patient portal called "MyChart".  Sign up information is provided on this After Visit Summary.  MyChart is used to connect with patients for Virtual Visits (Telemedicine).  Patients are able to view lab/test results, encounter notes, upcoming appointments, etc.  Non-urgent messages can be sent to your provider as well.   To learn more about what you can do with MyChart, go to [email or web address].    Your next appointment:   12 month(s)  The format for your next appointment:   In Person  Provider:   Lennie Odor, MD    Other Instructions   Your cardiac CT will be scheduled at one of the below locations:   [facility] [address] [place], [place] [address] [phone]  If scheduled at [facility], please arrive at the [facility] and Children's Entrance (Entrance C2) of [facility] 30  minutes prior to test start time. You can use the FREE valet parking offered at entrance C (encouraged to  control the heart rate for the test)  Proceed to the [facility] Radiology Department (first floor) to check-in and test prep.  All radiology patients and guests should use entrance C2 at [facility], accessed from [facility], even though the hospital's physical address listed is [address]      Please follow these instructions carefully (unless otherwise directed):  Hold all erectile dysfunction medications at least 3 days (72 hrs) prior to test.  On the Night Before the Test: Be sure to Drink plenty of water. Do not consume any caffeinated/decaffeinated beverages or chocolate 12 hours prior to your test. Do not take any antihistamines 12 hours prior to your test.  On the Day of the Test: Drink plenty of water until 1 hour prior to the test. Do not eat any food 4 hours prior to the test. You may take your regular medications prior to the test.  Take metoprolol (Lopressor) two hours prior to test. HOLD Furosemide/Hydrochlorothiazide morning of the test. FEMALES- please wear underwire-free bra if available, avoid dresses & tight clothing     After the Test: Drink plenty of water. After receiving IV contrast, you may experience a mild flushed feeling. This is normal. On occasion, you may experience a mild rash up to 24 hours after the test. This is not dangerous. If this occurs, you can take Benadryl 25 mg and increase your fluid intake. If you experience trouble breathing, this can be serious. If it is severe call 911 IMMEDIATELY. If it is mild, please call our office. If you take any of these medications: Glipizide/Metformin, Avandament, Glucavance, please do not take 48 hours after completing test unless otherwise instructed.  We will call to schedule your test 2-4 weeks out understanding that some insurance companies will need an authorization  prior to the service being performed.   For non-scheduling related questions, please contact the cardiac imaging nurse navigator should you have any questions/concerns: Rockwell Alexandria, Cardiac Imaging Nurse Navigator Larey Brick, Cardiac Imaging Nurse Navigator [facility] Direct Office Dial: [phone]   For scheduling needs, including cancellations and rescheduling, please call [place], [phone].           Signed, Lenna Gilford. Flora Lipps, MD, [facility]  [address] [place], [place] [address] [phone]  03/14/2022 11:48 AM

## 2022-03-14 ENCOUNTER — Ambulatory Visit (INDEPENDENT_AMBULATORY_CARE_PROVIDER_SITE_OTHER): Payer: Medicare Other | Admitting: Cardiovascular Disease

## 2022-03-14 ENCOUNTER — Encounter: Payer: Self-pay | Admitting: Cardiovascular Disease

## 2022-03-14 VITALS — BP 124/100 | HR 93 | Ht 63.0 in | Wt 217.0 lb

## 2022-03-14 DIAGNOSIS — E785 Hyperlipidemia, unspecified: Secondary | ICD-10-CM | POA: Diagnosis not present

## 2022-03-14 DIAGNOSIS — R931 Abnormal findings on diagnostic imaging of heart and coronary circulation: Secondary | ICD-10-CM

## 2022-03-14 DIAGNOSIS — I25118 Atherosclerotic heart disease of native coronary artery with other forms of angina pectoris: Secondary | ICD-10-CM | POA: Diagnosis not present

## 2022-03-14 DIAGNOSIS — E669 Obesity, unspecified: Secondary | ICD-10-CM

## 2022-03-14 DIAGNOSIS — I10 Essential (primary) hypertension: Secondary | ICD-10-CM | POA: Diagnosis not present

## 2022-03-14 LAB — BASIC METABOLIC PANEL
BUN/Creatinine Ratio: 8 — ABNORMAL LOW (ref 10–24)
BUN: 7 mg/dL — ABNORMAL LOW (ref 8–27)
CO2: 17 mmol/L — ABNORMAL LOW (ref 20–29)
Calcium: 9.3 mg/dL (ref 8.6–10.2)
Chloride: 104 mmol/L (ref 96–106)
Creatinine, Ser: 0.87 mg/dL (ref 0.76–1.27)
Glucose: 95 mg/dL (ref 70–99)
Potassium: 4.6 mmol/L (ref 3.5–5.2)
Sodium: 136 mmol/L (ref 134–144)
eGFR: 95 mL/min/{1.73_m2} (ref >59)

## 2022-03-14 MED ORDER — METOPROLOL TARTRATE 100 MG PO TABS: ORAL_TABLET | ORAL | 0 refills | Status: DC

## 2022-03-14 NOTE — Patient Instructions (Signed)
Medication Instructions:  TAKE Metoprolol 100 mg two hours before CT when scheduled.   *If you need a refill on your cardiac medications before your next appointment, please call your pharmacy*   Lab Work: BMET today  If you have labs (blood work) drawn today and your tests are completely normal, you will receive your results only by: MyChart Message (if you have MyChart) OR A paper copy in the mail If you have any lab test that is abnormal or we need to change your treatment, we will call you to review the results.   Testing/Procedures: Echocardiogram - Your physician has requested that you have an echocardiogram. Echocardiography is a painless test that uses sound waves to create images of your heart. It provides your doctor with information about the size and shape of your heart and how well your heart's chambers and valves are working. This procedure takes approximately one hour. There are no restrictions for this procedure.   Your physician has requested that you have cardiac CT. Cardiac computed tomography (CT) is a painless test that uses an x-ray machine to take clear, detailed pictures of your heart. For further information please visit [email or web address] Please follow instruction sheet as given.   Follow-Up: At [facility], you and your health needs are our priority.  As part of our continuing mission to provide you with exceptional heart care, we have created designated Provider Care Teams.  These Care Teams include your primary Cardiologist (physician) and Advanced Practice Providers (APPs -  Physician Assistants and Nurse Practitioners) who all work together to provide you with the care you need, when you need it.  We recommend signing up for the patient portal called "MyChart".  Sign up information is provided on this After Visit Summary.  MyChart is used to connect with patients for Virtual Visits (Telemedicine).  Patients are able to view lab/test results, encounter  notes, upcoming appointments, etc.  Non-urgent messages can be sent to your provider as well.   To learn more about what you can do with MyChart, go to [email or web address].    Your next appointment:   12 month(s)  The format for your next appointment:   In Person  Provider:   Lennie Odor, MD    Other Instructions   Your cardiac CT will be scheduled at one of the below locations:   [facility] [address] [place], [place] [address] [phone]  If scheduled at [facility], please arrive at the [facility] and Children's Entrance (Entrance C2) of [facility] 30 minutes prior to test start time. You can use the FREE valet parking offered at entrance C (encouraged to control the heart rate for the test)  Proceed to the [facility] Radiology Department (first floor) to check-in and test prep.  All radiology patients and guests should use entrance C2 at [facility], accessed from [facility], even though the hospital's physical address listed is [address]      Please follow these instructions carefully (unless otherwise directed):  Hold all erectile dysfunction medications at least 3 days (72 hrs) prior to test.  On the Night Before the Test: Be sure to Drink plenty of water. Do not consume any caffeinated/decaffeinated beverages or chocolate 12 hours prior to your test. Do not take any antihistamines 12 hours prior to your test.  On the Day of the Test: Drink plenty of water until 1 hour prior to the test. Do not eat any food 4 hours  prior to the test. You may take your regular medications prior to the test.  Take metoprolol (Lopressor) two hours prior to test. HOLD Furosemide/Hydrochlorothiazide morning of the test. FEMALES- please wear underwire-free bra if available, avoid dresses & tight clothing     After the Test: Drink plenty of water. After receiving IV contrast, you may experience a mild  flushed feeling. This is normal. On occasion, you may experience a mild rash up to 24 hours after the test. This is not dangerous. If this occurs, you can take Benadryl 25 mg and increase your fluid intake. If you experience trouble breathing, this can be serious. If it is severe call 911 IMMEDIATELY. If it is mild, please call our office. If you take any of these medications: Glipizide/Metformin, Avandament, Glucavance, please do not take 48 hours after completing test unless otherwise instructed.  We will call to schedule your test 2-4 weeks out understanding that some insurance companies will need an authorization prior to the service being performed.   For non-scheduling related questions, please contact the cardiac imaging nurse navigator should you have any questions/concerns: Rockwell Alexandria, Cardiac Imaging Nurse Navigator Larey Brick, Cardiac Imaging Nurse Navigator [facility] Direct Office Dial: [phone]   For scheduling needs, including cancellations and rescheduling, please call [place], [phone].

## 2022-03-20 ENCOUNTER — Other Ambulatory Visit: Payer: Self-pay | Admitting: Internal Medicine

## 2022-03-20 DIAGNOSIS — I10 Essential (primary) hypertension: Secondary | ICD-10-CM

## 2022-03-26 ENCOUNTER — Ambulatory Visit (HOSPITAL_COMMUNITY): Payer: Medicare Other | Attending: Cardiovascular Disease

## 2022-03-26 DIAGNOSIS — I25118 Atherosclerotic heart disease of native coronary artery with other forms of angina pectoris: Secondary | ICD-10-CM | POA: Insufficient documentation

## 2022-03-26 LAB — ECHOCARDIOGRAM COMPLETE
Area-P 1/2: 3.54 cm2
S' Lateral: 3.2 cm

## 2022-04-01 ENCOUNTER — Telehealth (HOSPITAL_COMMUNITY): Payer: Self-pay | Admitting: Emergency Medicine

## 2022-04-01 NOTE — Telephone Encounter (Signed)
Reaching out to patient to offer assistance regarding upcoming cardiac imaging study; pt verbalizes understanding of appt date/time, parking situation and where to check in, pre-test NPO status and medications ordered, and verified current allergies; name and call back number provided for further questions should they arise Rockwell Alexandria RN Navigator Cardiac Imaging [facility] [phone] office [phone] cell  Difficult IV  Arrival 1130 w/c entrance Aware nitro 100mg  metoprolol

## 2022-04-02 ENCOUNTER — Ambulatory Visit (HOSPITAL_COMMUNITY)
Admission: RE | Admit: 2022-04-02 | Discharge: 2022-04-02 | Disposition: A | Discharge status: Home / Self Care | Payer: Medicare Other | Source: Ambulatory Visit | Attending: Cardiovascular Disease | Admitting: Cardiovascular Disease

## 2022-04-02 ENCOUNTER — Ambulatory Visit (HOSPITAL_BASED_OUTPATIENT_CLINIC_OR_DEPARTMENT_OTHER)
Admission: RE | Admit: 2022-04-02 | Discharge: 2022-04-02 | Disposition: A | Discharge status: Home / Self Care | Payer: Medicare Other | Source: Ambulatory Visit | Attending: Cardiovascular Disease | Admitting: Cardiovascular Disease

## 2022-04-02 ENCOUNTER — Other Ambulatory Visit: Payer: Self-pay | Admitting: Cardiovascular Disease

## 2022-04-02 DIAGNOSIS — R931 Abnormal findings on diagnostic imaging of heart and coronary circulation: Secondary | ICD-10-CM | POA: Insufficient documentation

## 2022-04-02 DIAGNOSIS — I25118 Atherosclerotic heart disease of native coronary artery with other forms of angina pectoris: Secondary | ICD-10-CM | POA: Diagnosis not present

## 2022-04-02 MED ORDER — NITROGLYCERIN 0.4 MG SL SUBL
0.8000 mg | SUBLINGUAL_TABLET | Freq: Once | SUBLINGUAL | Status: AC
  Administered 2022-04-02: 0.8 mg via SUBLINGUAL

## 2022-04-02 MED ORDER — IOHEXOL 350 MG/ML SOLN
100.0000 mL | Freq: Once | INTRAVENOUS | Status: AC | PRN
  Administered 2022-04-02: 100 mL via INTRAVENOUS

## 2022-04-02 MED ORDER — NITROGLYCERIN 0.4 MG SL SUBL
SUBLINGUAL_TABLET | SUBLINGUAL | Status: AC
  Filled 2022-04-02: qty 2

## 2022-04-02 NOTE — Progress Notes (Signed)
CT FFR ordered.  Blake Spore T. Flora Lipps, MD, [facility]  [facility]  [address] [place], [place] [address] [phone]  2:40 PM

## 2022-04-04 ENCOUNTER — Other Ambulatory Visit: Payer: Self-pay

## 2022-04-04 DIAGNOSIS — E785 Hyperlipidemia, unspecified: Secondary | ICD-10-CM

## 2022-04-04 MED ORDER — ROSUVASTATIN CALCIUM 40 MG PO TABS: 40.0000 mg | ORAL_TABLET | Freq: Every day | ORAL | 2 refills | Status: DC

## 2022-06-19 ENCOUNTER — Other Ambulatory Visit: Payer: Self-pay | Admitting: Internal Medicine

## 2022-06-19 DIAGNOSIS — I10 Essential (primary) hypertension: Secondary | ICD-10-CM

## 2022-08-08 DIAGNOSIS — H04123 Dry eye syndrome of bilateral lacrimal glands: Secondary | ICD-10-CM | POA: Diagnosis not present

## 2022-09-14 ENCOUNTER — Other Ambulatory Visit: Payer: Self-pay | Admitting: Internal Medicine

## 2022-09-14 DIAGNOSIS — I10 Essential (primary) hypertension: Secondary | ICD-10-CM

## 2022-09-26 ENCOUNTER — Telehealth: Payer: Self-pay

## 2022-09-26 NOTE — Telephone Encounter (Signed)
N/A unable to leave a message for patient to call back to schedule Medicare Annual Wellness Visit.  No hx of AWV eligible as of 09/12/22  Please schedule at anytime with [facility] if patient calls the office back.    30 minute appointment.  Any questions, please call me at [phone]

## 2022-10-09 ENCOUNTER — Ambulatory Visit (INDEPENDENT_AMBULATORY_CARE_PROVIDER_SITE_OTHER): Payer: Medicare Other

## 2022-10-09 VITALS — Ht 63.0 in | Wt 217.0 lb

## 2022-10-09 DIAGNOSIS — Z1211 Encounter for screening for malignant neoplasm of colon: Secondary | ICD-10-CM | POA: Diagnosis not present

## 2022-10-09 DIAGNOSIS — Z0000 Encounter for general adult medical examination without abnormal findings: Secondary | ICD-10-CM | POA: Diagnosis not present

## 2022-10-09 NOTE — Patient Instructions (Addendum)
Blake Anderson , Thank you for taking time to come for your Medicare Wellness Visit. I appreciate your ongoing commitment to your health goals. Please review the following plan we discussed and let me know if I can assist you in the future.   These are the goals we discussed:  Goals       Stay Healthy (pt-stated)        This is a list of the screening recommended for you and due dates:  Health Maintenance  Topic Date Due   DTaP/Tdap/Td vaccine (2 - Td or Tdap) 10/06/2021   Cologuard (Stool DNA test)  10/10/2023*   Medicare Annual Wellness Visit  10/10/2023   Pneumonia Vaccine  Completed   Flu Shot  Completed   COVID-19 Vaccine  Completed   Hepatitis C Screening: USPSTF Recommendation to screen - Ages 18-79 yo.  Completed   Zoster (Shingles) Vaccine  Completed   HPV Vaccine  Aged Out  *Topic was postponed. The date shown is not the original due date.    Advanced directives: Advance directive discussed with you today. Even though you declined this today, please call our office should you change your mind, and we can give you the proper paperwork for you to fill out.   Conditions/risks identified: None  Next appointment: Follow up in one year for your annual wellness visit.    Preventive Care 8 Years and Older, Male  Preventive care refers to lifestyle choices and visits with your health care provider that can promote health and wellness. What does preventive care include? A yearly physical exam. This is also called an annual well check. Dental exams once or twice a year. Routine eye exams. Ask your health care provider how often you should have your eyes checked. Personal lifestyle choices, including: Daily care of your teeth and gums. Regular physical activity. Eating a healthy diet. Avoiding tobacco and drug use. Limiting alcohol use. Practicing safe sex. Taking low doses of aspirin every day. Taking vitamin and mineral supplements as recommended by your health care  provider. What happens during an annual well check? The services and screenings done by your health care provider during your annual well check will depend on your age, overall health, lifestyle risk factors, and family history of disease. Counseling  Your health care provider may ask you questions about your: Alcohol use. Tobacco use. Drug use. Emotional well-being. Home and relationship well-being. Sexual activity. Eating habits. History of falls. Memory and ability to understand (cognition). Work and work Statistician. Screening  You may have the following tests or measurements: Height, weight, and BMI. Blood pressure. Lipid and cholesterol levels. These may be checked every 5 years, or more frequently if you are over 81 years old. Skin check. Lung cancer screening. You may have this screening every year starting at age 37 if you have a 30-pack-year history of smoking and currently smoke or have quit within the past 15 years. Fecal occult blood test (FOBT) of the stool. You may have this test every year starting at age 26. Flexible sigmoidoscopy or colonoscopy. You may have a sigmoidoscopy every 5 years or a colonoscopy every 10 years starting at age 64. Prostate cancer screening. Recommendations will vary depending on your family history and other risks. Hepatitis C blood test. Hepatitis B blood test. Sexually transmitted disease (STD) testing. Diabetes screening. This is done by checking your blood sugar (glucose) after you have not eaten for a while (fasting). You may have this done every 1-3 years. Abdominal aortic aneurysm (  AAA) screening. You may need this if you are a current or former smoker. Osteoporosis. You may be screened starting at age 62 if you are at high risk. Talk with your health care provider about your test results, treatment options, and if necessary, the need for more tests. Vaccines  Your health care provider may recommend certain vaccines, such  as: Influenza vaccine. This is recommended every year. Tetanus, diphtheria, and acellular pertussis (Tdap, Td) vaccine. You may need a Td booster every 10 years. Zoster vaccine. You may need this after age 37. Pneumococcal 13-valent conjugate (PCV13) vaccine. One dose is recommended after age 38. Pneumococcal polysaccharide (PPSV23) vaccine. One dose is recommended after age 80. Talk to your health care provider about which screenings and vaccines you need and how often you need them. This information is not intended to replace advice given to you by your health care provider. Make sure you discuss any questions you have with your health care provider. Document Released: 08/25/2015 Document Revised: 04/17/2016 Document Reviewed: 05/30/2015 Elsevier Interactive Patient Education  2017 [place] Prevention in the Home Falls can cause injuries. They can happen to people of all ages. There are many things you can do to make your home safe and to help prevent falls. What can I do on the outside of my home? Regularly fix the edges of walkways and driveways and fix any cracks. Remove anything that might make you trip as you walk through a door, such as a raised step or threshold. Trim any bushes or trees on the path to your home. Use bright outdoor lighting. Clear any walking paths of anything that might make someone trip, such as rocks or tools. Regularly check to see if handrails are loose or broken. Make sure that both sides of any steps have handrails. Any raised decks and porches should have guardrails on the edges. Have any leaves, snow, or ice cleared regularly. Use sand or salt on walking paths during winter. Clean up any spills in your garage right away. This includes oil or grease spills. What can I do in the bathroom? Use night lights. Install grab bars by the toilet and in the tub and shower. Do not use towel bars as grab bars. Use non-skid mats or decals in the tub or  shower. If you need to sit down in the shower, use a plastic, non-slip stool. Keep the floor dry. Clean up any water that spills on the floor as soon as it happens. Remove soap buildup in the tub or shower regularly. Attach bath mats securely with double-sided non-slip rug tape. Do not have throw rugs and other things on the floor that can make you trip. What can I do in the bedroom? Use night lights. Make sure that you have a light by your bed that is easy to reach. Do not use any sheets or blankets that are too big for your bed. They should not hang down onto the floor. Have a firm chair that has side arms. You can use this for support while you get dressed. Do not have throw rugs and other things on the floor that can make you trip. What can I do in the kitchen? Clean up any spills right away. Avoid walking on wet floors. Keep items that you use a lot in easy-to-reach places. If you need to reach something above you, use a strong step stool that has a grab bar. Keep electrical cords out of the way. Do not use floor polish  or wax that makes floors slippery. If you must use wax, use non-skid floor wax. Do not have throw rugs and other things on the floor that can make you trip. What can I do with my stairs? Do not leave any items on the stairs. Make sure that there are handrails on both sides of the stairs and use them. Fix handrails that are broken or loose. Make sure that handrails are as long as the stairways. Check any carpeting to make sure that it is firmly attached to the stairs. Fix any carpet that is loose or worn. Avoid having throw rugs at the top or bottom of the stairs. If you do have throw rugs, attach them to the floor with carpet tape. Make sure that you have a light switch at the top of the stairs and the bottom of the stairs. If you do not have them, ask someone to add them for you. What else can I do to help prevent falls? Wear shoes that: Do not have high heels. Have  rubber bottoms. Are comfortable and fit you well. Are closed at the toe. Do not wear sandals. If you use a stepladder: Make sure that it is fully opened. Do not climb a closed stepladder. Make sure that both sides of the stepladder are locked into place. Ask someone to hold it for you, if possible. Clearly mark and make sure that you can see: Any grab bars or handrails. First and last steps. Where the edge of each step is. Use tools that help you move around (mobility aids) if they are needed. These include: Canes. Walkers. Scooters. Crutches. Turn on the lights when you go into a dark area. Replace any light bulbs as soon as they burn out. Set up your furniture so you have a clear path. Avoid moving your furniture around. If any of your floors are uneven, fix them. If there are any pets around you, be aware of where they are. Review your medicines with your doctor. Some medicines can make you feel dizzy. This can increase your chance of falling. Ask your doctor what other things that you can do to help prevent falls. This information is not intended to replace advice given to you by your health care provider. Make sure you discuss any questions you have with your health care provider. Document Released: 05/25/2009 Document Revised: 01/04/2016 Document Reviewed: 09/02/2014 Elsevier Interactive Patient Education  2017 [employer].

## 2022-10-09 NOTE — Progress Notes (Signed)
Subjective:   Blake Anderson is a 67 y.o. male who presents for Medicare Annual/Subsequent preventive examination.  Review of Systems    Virtual Visit via Telephone Note  I connected with  Blake Anderson on 10/09/22 at  4:00 PM EST by telephone and verified that I am speaking with the correct person using two identifiers.  Location: Patient: Home Provider: Office Persons participating in the virtual visit: patient/Nurse Health Advisor   I discussed the limitations, risks, security and privacy concerns of performing an evaluation and management service by telephone and the availability of in person appointments. The patient expressed understanding and agreed to proceed.  Interactive audio and video telecommunications were attempted between this nurse and patient, however failed, due to patient having technical difficulties OR patient did not have access to video capability.  We continued and completed visit with audio only.  Some vital signs may be absent or patient reported.   Blake Peaches, LPN  Cardiac Risk Factors include: advanced age (>33mn, >>52women);male gender;hypertension     Objective:    Today's Vitals   10/09/22 1523  Weight: 217 lb (98.4 kg)  Height: '5\' 3"'$  (1.6 m)   Body mass index is 38.44 kg/m.     10/09/2022    3:30 PM  Advanced Directives  Does Patient Have a Medical Advance Directive? No  Would patient like information on creating a medical advance directive? No - Patient declined    Current Medications (verified) Outpatient Encounter Medications as of 10/09/2022  Medication Sig   aspirin 81 MG tablet Take 81 mg by mouth daily.   cholecalciferol (VITAMIN D3) 25 MCG (1000 UT) tablet Take 1,000 Units by mouth daily.   irbesartan (AVAPRO) 300 MG tablet TAKE 1 TABLET BY MOUTH EVERY DAY   ketoconazole (NIZORAL) 2 % cream Apply 1 Application topically 2 (two) times daily.   rosuvastatin (CRESTOR) 40 MG tablet Take 1 tablet (40 mg total) by  mouth daily.   [DISCONTINUED] metoprolol tartrate (LOPRESSOR) 100 MG tablet Take 1 tablet by mouth once for procedure.   No facility-administered encounter medications on file as of 10/09/2022.    Allergies (verified) Lisinopril   History: Past Medical History:  Diagnosis Date   GERD (gastroesophageal reflux disease)    Hyperlipidemia    Hypertension    History reviewed. No pertinent surgical history. Family History  Problem Relation Age of Onset   Arthritis Mother    Alcohol abuse Father    Asthma Neg Hx    Cancer Neg Hx    COPD Neg Hx    Diabetes Neg Hx    Heart disease Neg Hx    Hyperlipidemia Neg Hx    Hypertension Neg Hx    Kidney disease Neg Hx    Stroke Neg Hx    Social History   Socioeconomic History   Marital status: Single    Spouse name: Not on file   Number of children: Not on file   Years of education: Not on file   Highest education level: Not on file  Occupational History   Occupation: Retired  Tobacco Use   Smoking status: Never   Smokeless tobacco: Never  Substance and Sexual Activity   Alcohol use: No   Drug use: No   Sexual activity: Not Currently  Other Topics Concern   Not on file  Social History Narrative   Not on file   Social Determinants of Health   Financial Resource Strain: Low Risk  (10/09/2022)   Overall  Financial Resource Strain (CARDIA)    Difficulty of Paying Living Expenses: Not hard at all  Food Insecurity: No Food Insecurity (10/09/2022)   Hunger Vital Sign    Worried About Running Out of Food in the Last Year: Never true    Ran Out of Food in the Last Year: Never true  Transportation Needs: No Transportation Needs (10/09/2022)   PRAPARE - Hydrologist (Medical): No    Lack of Transportation (Non-Medical): No  Physical Activity: Inactive (10/09/2022)   Exercise Vital Sign    Days of Exercise per Week: 0 days    Minutes of Exercise per Session: 0 min  Stress: No Stress Concern Present  (10/09/2022)   [place]    Feeling of Stress : Not at all  Social Connections: Socially Isolated (10/09/2022)   Social Connection and Isolation Panel [NHANES]    Frequency of Communication with Friends and Family: More than three times a week    Frequency of Social Gatherings with Friends and Family: More than three times a week    Attends Religious Services: Never    Marine scientist or Organizations: No    Attends Music therapist: Never    Marital Status: Never married    Tobacco Counseling Counseling given: Not Answered   Clinical Intake:  Pre-visit preparation completed: Yes  Pain : No/denies pain     BMI - recorded: 38.44 Nutritional Status: BMI > 30  Obese Nutritional Risks: None Diabetes: No  How often do you need to have someone help you when you read instructions, pamphlets, or other written materials from your doctor or pharmacy?: 1 - Never  Diabetic?  No  Interpreter Needed?: No  Information entered by :: Blake Arbour LPN   Activities of Daily Living    10/09/2022    3:29 PM 10/08/2022    7:00 PM  In your present state of health, do you have any difficulty performing the following activities:  Hearing? 0 0  Vision? 0 0  Difficulty concentrating or making decisions? 0 0  Walking or climbing stairs? 0 0  Dressing or bathing? 0 0  Doing errands, shopping? 0 0  Preparing Food and eating ? N N  Using the Toilet? N N  In the past six months, have you accidently leaked urine? N N  Do you have problems with loss of bowel control? N N  Managing your Medications? N N  Managing your Finances? N N  Housekeeping or managing your Housekeeping? N N    Patient Care Team: Blake Lima, MD as PCP - General (Internal Medicine)  Indicate any recent Medical Services you may have received from other than [facility] providers in the past year (date may be approximate).      Assessment:   This is a routine wellness examination for Blake Anderson.  Hearing/Vision screen Hearing Screening - Comments:: Denies hearing difficulties   Vision Screening - Comments:: Wears rx glasses - up to date with routine eye exams with  [facility]  Dietary issues and exercise activities discussed: Exercise limited by: None identified   Goals Addressed               This Visit's Progress     Stay Healthy (pt-stated)         Depression Screen    02/06/2022    9:44 AM 10/10/2020   10:00 AM 05/03/2019    3:15 PM 07/28/2018  3:59 PM  PHQ 2/9 Scores  PHQ - 2 Score 0 0 0 0  PHQ- 9 Score 4       Fall Risk    10/09/2022    3:29 PM 10/08/2022    7:00 PM 02/06/2022    9:43 AM 10/10/2020   10:00 AM 05/03/2019    3:15 PM  [place] in the past year? 0 0 0 1 0  Number falls in past yr: 0 0  0 0  Injury with Fall? 0 0  1 0  Risk for fall due to : No Fall Risks      Follow up Falls prevention discussed    Falls evaluation completed    FALL RISK PREVENTION PERTAINING TO THE HOME:  Any stairs in or around the home? No  If so, are there any without handrails? No  Home free of loose throw rugs in walkways, pet beds, electrical cords, etc? Yes  Adequate lighting in your home to reduce risk of falls? Yes   ASSISTIVE DEVICES UTILIZED TO PREVENT FALLS:  Life alert? No  Use of a cane, walker or w/c? No  Grab bars in the bathroom? No  Shower chair or bench in shower? No  Elevated toilet seat or a handicapped toilet? No   TIMED UP AND GO:  Was the test performed? No . Audio Visit   Cognitive Function:        10/09/2022    3:30 PM  6CIT Screen  What Year? 0 points  What month? 0 points  What time? 0 points  Count back from 20 0 points  Months in reverse 0 points  Repeat phrase 0 points  Total Score 0 points    Immunizations Immunization History  Administered Date(s) Administered   Fluad Quad(high Dose 65+) 05/10/2021   Hepatitis A 01/25/2017,  07/28/2017   Influenza Inj Mdck Quad Pf 05/09/2017   Influenza,inj,Quad PF,6+ Mos 05/14/2013, 09/19/2014, 04/14/2018, 05/03/2019, 05/11/2020   PFIZER(Purple Top)SARS-COV-2 Vaccination 11/11/2019, 11/11/2019, 12/02/2019, 07/24/2020   PNEUMOCOCCAL CONJUGATE-20 05/10/2021   Pfizer Covid-19 Vaccine Bivalent Booster 5y-11y 07/25/2021   Tdap 10/07/2011   Zoster Recombinat (Shingrix) 01/25/2017, 05/09/2017    TDAP status: Due, Education has been provided regarding the importance of this vaccine. Advised may receive this vaccine at local pharmacy or Health Dept. Aware to provide a copy of the vaccination record if obtained from local pharmacy or Health Dept. Verbalized acceptance and understanding.  Flu Vaccine status: Up to date  Pneumococcal vaccine status: Up to date  Covid-19 vaccine status: Information provided on how to obtain vaccines.   Qualifies for Shingles Vaccine? Yes   Zostavax completed Yes   Shingrix Completed?: Yes  Screening Tests Health Maintenance  Topic Date Due   DTaP/Tdap/Td (2 - Td or Tdap) 10/06/2021   Fecal DNA (Cologuard)  10/10/2023 (Originally 07/26/2022)   Medicare Annual Wellness (AWV)  10/10/2023   Pneumonia Vaccine 62+ Years old  Completed   INFLUENZA VACCINE  Completed   COVID-19 Vaccine  Completed   Hepatitis C Screening  Completed   Zoster Vaccines- Shingrix  Completed   HPV VACCINES  Aged Out    Health Maintenance  Health Maintenance Due  Topic Date Due   DTaP/Tdap/Td (2 - Td or Tdap) 10/06/2021    Colorectal cancer screening: Referral to GI placed 10/09/22. Pt aware the office will call re: appt.  Lung Cancer Screening: (Low Dose CT Chest recommended if Age 16-80 years, 30 pack-year currently smoking OR have quit w/in 15years.)  does not qualify.     Additional Screening:  Hepatitis C Screening: does qualify; Completed 01/24/16  Vision Screening: Recommended annual ophthalmology exams for early detection of glaucoma and other disorders of  the eye. Is the patient up to date with their annual eye exam?  Yes  Who is the provider or what is the name of the office in which the patient attends annual eye exams? [facility] If pt is not established with a provider, would they like to be referred to a provider to establish care? No .   Dental Screening: Recommended annual dental exams for proper oral hygiene  Community Resource Referral / Chronic Care Management:  CRR required this visit?  No   CCM required this visit?  No      Plan:     I have personally reviewed and noted the following in the patient's chart:   Medical and social history Use of alcohol, tobacco or illicit drugs  Current medications and supplements including opioid prescriptions. Patient is not currently taking opioid prescriptions. Functional ability and status Nutritional status Physical activity Advanced directives List of other physicians Hospitalizations, surgeries, and ER visits in previous 12 months Vitals Screenings to include cognitive, depression, and falls Referrals and appointments  In addition, I have reviewed and discussed with patient certain preventive protocols, quality metrics, and best practice recommendations. A written personalized care plan for preventive services as well as general preventive health recommendations were provided to patient.     Blake Peaches, LPN   [id number]   Nurse Notes: None

## 2022-10-11 NOTE — Addendum Note (Signed)
Addended by: [name] on: 10/11/2022 01:18 PM   Modules accepted: Orders

## 2022-10-11 NOTE — Progress Notes (Signed)
Subjective:   Blake Anderson is a 67 y.o. male who presents for Medicare Annual/Subsequent preventive examination.  Review of Systems     Cardiac Risk Factors include: advanced age (>66mn, >>3women);male gender;hypertension     Objective:    Today's Vitals   10/09/22 1523  Weight: 217 lb (98.4 kg)  Height: '5\' 3"'$  (1.6 m)   Body mass index is 38.44 kg/m.     10/09/2022    3:30 PM  Advanced Directives  Does Patient Have a Medical Advance Directive? No  Would patient like information on creating a medical advance directive? No - Patient declined    Current Medications (verified) Outpatient Encounter Medications as of 10/09/2022  Medication Sig   aspirin 81 MG tablet Take 81 mg by mouth daily.   cholecalciferol (VITAMIN D3) 25 MCG (1000 UT) tablet Take 1,000 Units by mouth daily.   irbesartan (AVAPRO) 300 MG tablet TAKE 1 TABLET BY MOUTH EVERY DAY   ketoconazole (NIZORAL) 2 % cream Apply 1 Application topically 2 (two) times daily.   rosuvastatin (CRESTOR) 40 MG tablet Take 1 tablet (40 mg total) by mouth daily.   [DISCONTINUED] metoprolol tartrate (LOPRESSOR) 100 MG tablet Take 1 tablet by mouth once for procedure.   No facility-administered encounter medications on file as of 10/09/2022.    Allergies (verified) Lisinopril   History: Past Medical History:  Diagnosis Date   GERD (gastroesophageal reflux disease)    Hyperlipidemia    Hypertension    History reviewed. No pertinent surgical history. Family History  Problem Relation Age of Onset   Arthritis Mother    Alcohol abuse Father    Asthma Neg Hx    Cancer Neg Hx    COPD Neg Hx    Diabetes Neg Hx    Heart disease Neg Hx    Hyperlipidemia Neg Hx    Hypertension Neg Hx    Kidney disease Neg Hx    Stroke Neg Hx    Social History   Socioeconomic History   Marital status: Single    Spouse name: Not on file   Number of children: Not on file   Years of education: Not on file   Highest education  level: Not on file  Occupational History   Occupation: Retired  Tobacco Use   Smoking status: Never   Smokeless tobacco: Never  Substance and Sexual Activity   Alcohol use: No   Drug use: No   Sexual activity: Not Currently  Other Topics Concern   Not on file  Social History Narrative   Not on file   Social Determinants of Health   Financial Resource Strain: Low Risk  (10/09/2022)   Overall Financial Resource Strain (CARDIA)    Difficulty of Paying Living Expenses: Not hard at all  Food Insecurity: No Food Insecurity (10/09/2022)   Hunger Vital Sign    Worried About Running Out of Food in the Last Year: Never true    Ran Out of Food in the Last Year: Never true  Transportation Needs: No Transportation Needs (10/09/2022)   PRAPARE - THydrologist(Medical): No    Lack of Transportation (Non-Medical): No  Physical Activity: Inactive (10/09/2022)   Exercise Vital Sign    Days of Exercise per Week: 0 days    Minutes of Exercise per Session: 0 min  Stress: No Stress Concern Present (10/09/2022)   [place]   Feeling of Stress : Not  at all  Social Connections: Socially Isolated (10/09/2022)   Social Connection and Isolation Panel [NHANES]    Frequency of Communication with Friends and Family: More than three times a week    Frequency of Social Gatherings with Friends and Family: More than three times a week    Attends Religious Services: Never    Marine scientist or Organizations: No    Attends Music therapist: Never    Marital Status: Never married    Tobacco Counseling Counseling given: Not Answered   Clinical Intake:  Pre-visit preparation completed: Yes  Pain : No/denies pain     BMI - recorded: 38.44 Nutritional Status: BMI > 30  Obese Nutritional Risks: None Diabetes: No  How often do you need to have someone help you when you read instructions,  pamphlets, or other written materials from your doctor or pharmacy?: 1 - Never  Diabetic?  Interpreter Needed?: No  Information entered by :: Rolene Arbour LPN   Activities of Daily Living    10/09/2022    3:29 PM 10/08/2022    7:00 PM  In your present state of health, do you have any difficulty performing the following activities:  Hearing? 0 0  Vision? 0 0  Difficulty concentrating or making decisions? 0 0  Walking or climbing stairs? 0 0  Dressing or bathing? 0 0  Doing errands, shopping? 0 0  Preparing Food and eating ? N N  Using the Toilet? N N  In the past six months, have you accidently leaked urine? N N  Do you have problems with loss of bowel control? N N  Managing your Medications? N N  Managing your Finances? N N  Housekeeping or managing your Housekeeping? N N    Patient Care Team: Janith Lima, MD as PCP - General (Internal Medicine)  Indicate any recent Medical Services you may have received from other than [facility] providers in the past year (date may be approximate).     Assessment:   This is a routine wellness examination for Blake Anderson.  Hearing/Vision screen Hearing Screening - Comments:: Denies hearing difficulties   Vision Screening - Comments:: Wears rx glasses - up to date with routine eye exams with  [place] issues and exercise activities discussed: Exercise limited by: None identified   Goals Addressed               This Visit's Progress     Stay Healthy (pt-stated)         Depression Screen    02/06/2022    9:44 AM 10/10/2020   10:00 AM 05/03/2019    3:15 PM 07/28/2018    3:59 PM  PHQ 2/9 Scores  PHQ - 2 Score 0 0 0 0  PHQ- 9 Score 4       Fall Risk    10/09/2022    3:29 PM 10/08/2022    7:00 PM 02/06/2022    9:43 AM 10/10/2020   10:00 AM 05/03/2019    3:15 PM  Fall Risk   Falls in the past year? 0 0 0 1 0  Number falls in past yr: 0 0  0 0  Injury with Fall? 0 0  1 0  Risk for fall due to : No Fall Risks       Follow up Falls prevention discussed    Falls evaluation completed    FALL RISK PREVENTION PERTAINING TO THE HOME:  Any stairs in or around the home?  If so, are there any without handrails?  Home free of loose throw rugs in walkways, pet beds, electrical cords, etc?  Adequate lighting in your home to reduce risk of falls?   ASSISTIVE DEVICES UTILIZED TO PREVENT FALLS:  Life alert?  Use of a cane, walker or w/c?  Grab bars in the bathroom?  Shower chair or bench in shower?  Elevated toilet seat or a handicapped toilet?   TIMED UP AND GO:  Was the test performed? .  Length of time to ambulate 10 feet:  sec.     Cognitive Function:        10/09/2022    3:30 PM  6CIT Screen  What Year? 0 points  What month? 0 points  What time? 0 points  Count back from 20 0 points  Months in reverse 0 points  Repeat phrase 0 points  Total Score 0 points    Immunizations Immunization History  Administered Date(s) Administered   Fluad Quad(high Dose 65+) 05/10/2021   Hepatitis A 01/25/2017, 07/28/2017   Influenza Inj Mdck Quad Pf 05/09/2017   Influenza,inj,Quad PF,6+ Mos 05/14/2013, 09/19/2014, 04/14/2018, 05/03/2019, 05/11/2020   PFIZER(Purple Top)SARS-COV-2 Vaccination 11/11/2019, 11/11/2019, 12/02/2019, 07/24/2020   PNEUMOCOCCAL CONJUGATE-20 05/10/2021   Pfizer Covid-19 Vaccine Bivalent Booster 5y-11y 07/25/2021   Tdap 10/07/2011   Zoster Recombinat (Shingrix) 01/25/2017, 05/09/2017            Qualifies for Shingles Vaccine?   Zostavax completed     Screening Tests Health Maintenance  Topic Date Due   DTaP/Tdap/Td (2 - Td or Tdap) 10/06/2021   Fecal DNA (Cologuard)  10/10/2023 (Originally 07/26/2022)   Medicare Annual Wellness (AWV)  10/10/2023   Pneumonia Vaccine 36+ Years old  Completed   INFLUENZA VACCINE  Completed   COVID-19 Vaccine  Completed   Hepatitis C Screening  Completed   Zoster Vaccines- Shingrix  Completed   HPV VACCINES  Aged Out     Health Maintenance  Health Maintenance Due  Topic Date Due   DTaP/Tdap/Td (2 - Td or Tdap) 10/06/2021      Lung Cancer Screening: (Low Dose CT Chest recommended if Age 44-80 years, 30 pack-year currently smoking OR have quit w/in 15years.)  qualify.   Lung Cancer Screening Referral:   Additional Screening:  Hepatitis C Screening:  qualify; Completed   Vision Screening: Recommended annual ophthalmology exams for early detection of glaucoma and other disorders of the eye. Is the patient up to date with their annual eye exam?   Who is the provider or what is the name of the office in which the patient attends annual eye exams?  If pt is not established with a provider, would they like to be referred to a provider to establish care? .   Dental Screening: Recommended annual dental exams for proper oral hygiene  Community Resource Referral / Chronic Care Management: CRR required this visit?    CCM required this visit?       Plan:     I have personally reviewed and noted the following in the patient's chart:   Medical and social history Use of alcohol, tobacco or illicit drugs  Current medications and supplements including opioid prescriptions.  Functional ability and status Nutritional status Physical activity Advanced directives List of other physicians Hospitalizations, surgeries, and ER visits in previous 12 months Vitals Screenings to include cognitive, depression, and falls Referrals and appointments  In addition, I have reviewed and discussed with patient certain preventive protocols, quality metrics, and best practice recommendations. A  written personalized care plan for preventive services as well as general preventive health recommendations were provided to patient.     [name], LPN   [id number]   Nurse Notes: This is an addendum to AWV completed on 10/09/22 Depression screening was completed with score of 0, also 6CIT was completed with score  0

## 2022-12-11 ENCOUNTER — Other Ambulatory Visit: Payer: Self-pay | Admitting: Internal Medicine

## 2022-12-11 DIAGNOSIS — I10 Essential (primary) hypertension: Secondary | ICD-10-CM

## 2022-12-19 ENCOUNTER — Telehealth: Payer: Self-pay | Admitting: Cardiovascular Disease

## 2022-12-19 DIAGNOSIS — E785 Hyperlipidemia, unspecified: Secondary | ICD-10-CM

## 2022-12-19 MED ORDER — ROSUVASTATIN CALCIUM 40 MG PO TABS: 40.0000 mg | ORAL_TABLET | Freq: Every day | ORAL | 1 refills | Status: DC

## 2022-12-19 NOTE — Telephone Encounter (Signed)
RX sent to pts pharmacy as requested. Spoke with pt. Pt is aware that mediation was sent to his pharmacy.

## 2022-12-19 NOTE — Telephone Encounter (Signed)
*  STAT* If patient is at the pharmacy, call can be transferred to refill team.   1. Which medications need to be refilled? (please list name of each medication and dose if known) Rosuvastatin  2. Which pharmacy/location (including street and city if local pharmacy) is medication to be sent to?  [facility]  [place], [place]   3. Do they need a 30 day or 90 day supply? Enough until 03-17-23

## 2022-12-19 NOTE — Addendum Note (Signed)
Addended by: Lamar Benes on: 12/19/2022 11:18 AM   Modules accepted: Orders

## 2022-12-27 ENCOUNTER — Other Ambulatory Visit: Payer: Self-pay | Admitting: Internal Medicine

## 2022-12-27 DIAGNOSIS — I10 Essential (primary) hypertension: Secondary | ICD-10-CM

## 2022-12-27 DIAGNOSIS — L219 Seborrheic dermatitis, unspecified: Secondary | ICD-10-CM

## 2022-12-30 ENCOUNTER — Telehealth: Payer: Self-pay | Admitting: Internal Medicine

## 2022-12-30 ENCOUNTER — Other Ambulatory Visit: Payer: Self-pay | Admitting: Internal Medicine

## 2022-12-30 DIAGNOSIS — I10 Essential (primary) hypertension: Secondary | ICD-10-CM

## 2022-12-30 DIAGNOSIS — L219 Seborrheic dermatitis, unspecified: Secondary | ICD-10-CM

## 2022-12-30 MED ORDER — IRBESARTAN 300 MG PO TABS: 300.0000 mg | ORAL_TABLET | Freq: Every day | ORAL | 0 refills | Status: DC

## 2022-12-30 MED ORDER — KETOCONAZOLE 2 % EX CREA: 1.0000 | TOPICAL_CREAM | Freq: Two times a day (BID) | CUTANEOUS | 3 refills | Status: AC

## 2022-12-30 NOTE — Telephone Encounter (Signed)
Prescription Request  12/30/2022  LOV: 02/06/2022  What is the name of the medication or equipment?  irbesartan (AVAPRO) 300 MG tablet   ketoconazole (NIZORAL) 2 % cream  Have you contacted your pharmacy to request a refill? No   Which pharmacy would you like this sent to?  [id number] [place] [place] - [address] AT [facility] OF [address] [place] [address] Phone: [phone] Fax: [phone]    Patient notified that their request is being sent to the clinical staff for review and that they should receive a response within 2 business days.   Please advise at Mobile [phone] (mobile)

## 2022-12-31 ENCOUNTER — Encounter: Payer: Self-pay | Admitting: Internal Medicine

## 2022-12-31 ENCOUNTER — Ambulatory Visit (INDEPENDENT_AMBULATORY_CARE_PROVIDER_SITE_OTHER): Payer: Medicare Other | Admitting: Internal Medicine

## 2022-12-31 VITALS — BP 114/72 | HR 96 | Temp 98.2°F | Ht 63.0 in | Wt 224.0 lb

## 2022-12-31 DIAGNOSIS — E785 Hyperlipidemia, unspecified: Secondary | ICD-10-CM

## 2022-12-31 DIAGNOSIS — I10 Essential (primary) hypertension: Secondary | ICD-10-CM

## 2022-12-31 DIAGNOSIS — R0609 Other forms of dyspnea: Secondary | ICD-10-CM | POA: Diagnosis not present

## 2022-12-31 DIAGNOSIS — I959 Hypotension, unspecified: Secondary | ICD-10-CM | POA: Diagnosis not present

## 2022-12-31 LAB — TROPONIN I (HIGH SENSITIVITY): High Sens Troponin I: 6 ng/L (ref 2–17)

## 2022-12-31 LAB — BASIC METABOLIC PANEL
BUN: 12 mg/dL (ref 6–23)
CO2: 21 mEq/L (ref 19–32)
Calcium: 9.4 mg/dL (ref 8.4–10.5)
Chloride: 101 mEq/L (ref 96–112)
Creatinine, Ser: 0.96 mg/dL (ref 0.40–1.50)
GFR: 81.83 mL/min (ref >60.00)
Glucose, Bld: 93 mg/dL (ref 70–99)
Potassium: 4.4 mEq/L (ref 3.5–5.1)
Sodium: 134 mEq/L — ABNORMAL LOW (ref 135–145)

## 2022-12-31 LAB — CBC WITH DIFFERENTIAL/PLATELET
Basophils Absolute: 0.1 10*3/uL (ref 0.0–0.1)
Basophils Relative: 0.9 % (ref 0.0–3.0)
Eosinophils Absolute: 0.2 10*3/uL (ref 0.0–0.7)
Eosinophils Relative: 1.6 % (ref 0.0–5.0)
HCT: 51.8 % (ref 39.0–52.0)
Hemoglobin: 17 g/dL (ref 13.0–17.0)
Lymphocytes Relative: 14.5 % (ref 12.0–46.0)
Lymphs Abs: 1.5 10*3/uL (ref 0.7–4.0)
MCHC: 32.9 g/dL (ref 30.0–36.0)
MCV: 90 fl (ref 78.0–100.0)
Monocytes Absolute: 1 10*3/uL (ref 0.1–1.0)
Monocytes Relative: 9.5 % (ref 3.0–12.0)
Neutro Abs: 7.8 10*3/uL — ABNORMAL HIGH (ref 1.4–7.7)
Neutrophils Relative %: 73.5 % (ref 43.0–77.0)
Platelets: 270 10*3/uL (ref 150.0–400.0)
RBC: 5.75 Mil/uL (ref 4.22–5.81)
RDW: 13.6 % (ref 11.5–15.5)
WBC: 10.6 10*3/uL — ABNORMAL HIGH (ref 4.0–10.5)

## 2022-12-31 LAB — LIPID PANEL
Cholesterol: 119 mg/dL (ref 0–200)
HDL: 38 mg/dL — ABNORMAL LOW (ref >39.00)
LDL Cholesterol: 56 mg/dL (ref 0–99)
NonHDL: 81.44
Total CHOL/HDL Ratio: 3
Triglycerides: 129 mg/dL (ref 0.0–149.0)
VLDL: 25.8 mg/dL (ref 0.0–40.0)

## 2022-12-31 LAB — TSH: TSH: 3.94 u[IU]/mL (ref 0.35–5.50)

## 2022-12-31 LAB — CORTISOL: Cortisol, Plasma: 9.5 ug/dL

## 2022-12-31 NOTE — Progress Notes (Unsigned)
Subjective:  Patient ID: Blake Anderson, male    DOB: 1955-09-03  Age: 67 y.o. MRN: [id number]  CC: No chief complaint on file.   HPI Blake Anderson presents for ***  Outpatient Medications Prior to Visit  Medication Sig Dispense Refill   aspirin 81 MG tablet Take 81 mg by mouth daily.     cholecalciferol (VITAMIN D3) 25 MCG (1000 UT) tablet Take 1,000 Units by mouth daily.     irbesartan (AVAPRO) 300 MG tablet Take 1 tablet (300 mg total) by mouth daily. 90 tablet 0   ketoconazole (NIZORAL) 2 % cream Apply 1 Application topically 2 (two) times daily. 60 g 3   rosuvastatin (CRESTOR) 40 MG tablet Take 1 tablet (40 mg total) by mouth daily. 90 tablet 1   No facility-administered medications prior to visit.    ROS Review of Systems  Constitutional:  Positive for unexpected weight change (wt gain).  Respiratory:  Positive for shortness of breath (doe).     Objective:  BP 114/72 (BP Location: Left Arm, Patient Position: Sitting, Cuff Size: Normal)   Pulse 96   Temp 98.2 F (36.8 C) (Oral)   Ht 5\' 3"  (1.6 m)   Wt 224 lb (101.6 kg)   SpO2 94%   BMI 39.68 kg/m   BP Readings from Last 3 Encounters:  12/31/22 114/72  04/02/22 112/69  03/14/22 (!) 124/100    Wt Readings from Last 3 Encounters:  12/31/22 224 lb (101.6 kg)  10/09/22 217 lb (98.4 kg)  03/14/22 217 lb (98.4 kg)    Physical Exam Cardiovascular:     Rate and Rhythm: Normal rate and regular rhythm.     Heart sounds: Normal heart sounds, S1 normal and S2 normal.     Comments: EKG - NSR, 90 bpm Normal EKG Musculoskeletal:     Right lower leg: No edema.     Left lower leg: No edema.     Lab Results  Component Value Date   WBC 7.7 02/06/2022   HGB 16.6 02/06/2022   HCT 49.0 02/06/2022   PLT 231.0 02/06/2022   GLUCOSE 95 03/14/2022   CHOL 127 05/10/2021   TRIG 116.0 05/10/2021   HDL 41.00 05/10/2021   LDLDIRECT 70.0 02/11/2017   LDLCALC 62 05/10/2021   ALT 19 05/10/2021   AST 21  05/10/2021   NA 136 03/14/2022   K 4.6 03/14/2022   CL 104 03/14/2022   CREATININE 0.87 03/14/2022   BUN 7 (L) 03/14/2022   CO2 17 (L) 03/14/2022   TSH 4.23 02/06/2022   PSA 0.57 02/06/2022   HGBA1C 5.7 02/11/2017    CT CORONARY MORPH W/CTA COR W/SCORE W/CA W/CM &/OR WO/CM  Addendum Date: 04/02/2022   ADDENDUM REPORT: 04/02/2022 16:11 EXAM: OVER-READ INTERPRETATION  CT CHEST The following report is a limited chest CT over-read performed by radiologist Dr. Irma [facility] on 04/02/2022. This over-read does not include interpretation of cardiac or coronary anatomy or pathology. The coronary CTA interpretation by the cardiologist is attached. COMPARISON:  Cardiac CT 02/22/2022 FINDINGS: Mediastinum/Nodes: No enlarged lymph nodes within the visualized mediastinum.No significant extracardiac vascular findings. Lungs/Pleura: There is no pleural effusion. The visualized lungs remain clear. Upper abdomen: No significant findings in the visualized upper abdomen. Musculoskeletal/Chest wall: No chest wall mass or suspicious osseous findings within the visualized chest. IMPRESSION: No significant extracardiac findings within the visualized chest. Electronically Signed   By: Carey Bullocks M.D.   On: 04/02/2022 16:11   Result Date:  04/02/2022 CLINICAL DATA:  Chest pain EXAM: Cardiac/Coronary CTA TECHNIQUE: A non-contrast, gated CT scan was obtained with axial slices of 3 mm through the heart for calcium scoring. Calcium scoring was performed using the Agatston method. A 120 kV prospective, gated, contrast cardiac scan was obtained. Gantry rotation speed was 250 msecs and collimation was 0.6 mm. Two sublingual nitroglycerin tablets (0.8 mg) were given. The 3D data set was reconstructed in 5% intervals of the 35-75% of the R-R cycle. Diastolic phases were analyzed on a dedicated workstation using MPR, MIP, and VRT modes. The patient received 95 cc of contrast. FINDINGS: Image quality:  Excellent. Noise artifact is: Limited. Coronary Arteries:  Normal coronary origin.  Right dominance. Left main: The left main is a large caliber vessel with a normal take off from the left coronary cusp that trifurcates into a LAD, LCX, and ramus intermedius. There is minimal calcified plaque (<25%). Left anterior descending artery: The proximal LAD contains moderate mixed density plaque (50-69%). The mid and distal segments are patent. A mid LAD myocardial bridge is present (normal variant). The first diagonal contains mild mixed density plaque (25-49%). The second diagonal is patent. Ramus intermedius: Too small for analysis. Left circumflex artery: The LCX is non-dominant. The proximal LCX contains mild calcified plaque (25-49%). The LCX gives off 1 patent OM branch. Right coronary artery: The RCA is dominant with normal take off from the right coronary cusp. There is minimal diffuse mixed density plaque (<25%). The RCA terminates as a PDA and right posterolateral branch without evidence of plaque or stenosis. Right Atrium: Right atrial size is within normal limits. Right Ventricle: The right ventricular cavity is within normal limits. Left Atrium: Left atrial size is normal in size with no left atrial appendage filling defect. Small PFO. Left Ventricle: The ventricular cavity size is within normal limits. Pulmonary arteries: Normal in size without proximal filling defect. Pulmonary veins: Normal pulmonary venous drainage. Pericardium: Normal thickness without significant effusion or calcium present. Prominent pericardial fat. Cardiac valves: The aortic valve is trileaflet without significant calcification. The mitral valve is normal without significant calcification. Aorta: Normal caliber without significant disease. Extra-cardiac findings: See attached radiology report for non-cardiac structures. IMPRESSION: 1. Coronary calcium score of 1286. This was 93rd percentile for age-, sex, and race-matched controls. 2.  Normal coronary origin with right dominance. 3. Moderate mixed density plaque (50-69%) in the proximal LAD. 4. Mild calcified plaque (25-49%) in the LCX. 5. Minimal mixed density plaque (<25%) in the RCA. 6. Mid LAD myocardial bridge (normal). 7. Small PFO. RECOMMENDATIONS: 1. Moderate stenosis. Consider symptom-guided anti-ischemic pharmacotherapy as well as risk factor modification per guideline directed care. Additional analysis with CT FFR will be submitted. Lennie Odor, MD Electronically Signed: By: Lennie Odor M.D. On: 04/02/2022 14:38   CT CORONARY FRACTIONAL FLOW RESERVE DATA PREP  Result Date: 04/02/2022 EXAM: CT FFR analysis was performed on the original cardiac CTA dataset. Diagrammatic representation of the CT FFR analysis is provided in a separate PDF document in PACS. This dictation was created using the PDF document and an interactive 3D model of the results. The 3D model is not available in the EMR/PACS. INTERPRETATION: CT FFR provides simultaneous calculation of pressure and flow across the entire coronary tree. For clinical decision making, CT FFR values should be obtained 1-2 cm distal to the lower border of each stenosis measured. Coronary CTA-related artifacts may impair the diagnostic accuracy of the original cardiac CTA and FFR CT results. *Due to the fact that CT  FFR represents a mathematically-derived analysis, it is recommended that the results be interpreted as follows: 1. CT FFR >0.80: Low likelihood of hemodynamic significance. 2. CT FFR 0.76-0.80: Borderline likelihood of hemodynamic significance. 3. CT FFR =< 0.75: High likelihood of hemodynamic significance. *Coronary CT Angiography-derived Fractional Flow Reserve Testing in Patients with Stable Coronary Artery Disease: Recommendations on Interpretation and Reporting. Radiology: Cardiothoracic Imaging. 2019;1(5):e190050 FINDINGS: 1. Left Main: 0.99; low likelihood of hemodynamic significance. 2. Prox LAD: 0.91; low likelihood  of hemodynamic significance. 3. D1: 0.86; low likelihood of hemodynamic significance. 4. LCX: 0.94; low likelihood of hemodynamic significance. 5. RCA: 0.96; low likelihood of hemodynamic significance. IMPRESSION: 1.  CT FFR analysis didn't show any significant stenosis. Lennie Odor, MD Electronically Signed   By: Lennie Odor M.D.   On: 04/02/2022 14:45   CT CORONARY FRACTIONAL FLOW RESERVE FLUID ANALYSIS  Result Date: 04/02/2022 EXAM: CT FFR analysis was performed on the original cardiac CTA dataset. Diagrammatic representation of the CT FFR analysis is provided in a separate PDF document in PACS. This dictation was created using the PDF document and an interactive 3D model of the results. The 3D model is not available in the EMR/PACS. INTERPRETATION: CT FFR provides simultaneous calculation of pressure and flow across the entire coronary tree. For clinical decision making, CT FFR values should be obtained 1-2 cm distal to the lower border of each stenosis measured. Coronary CTA-related artifacts may impair the diagnostic accuracy of the original cardiac CTA and FFR CT results. *Due to the fact that CT FFR represents a mathematically-derived analysis, it is recommended that the results be interpreted as follows: 1. CT FFR >0.80: Low likelihood of hemodynamic significance. 2. CT FFR 0.76-0.80: Borderline likelihood of hemodynamic significance. 3. CT FFR =< 0.75: High likelihood of hemodynamic significance. *Coronary CT Angiography-derived Fractional Flow Reserve Testing in Patients with Stable Coronary Artery Disease: Recommendations on Interpretation and Reporting. Radiology: Cardiothoracic Imaging. 2019;1(5):e190050 FINDINGS: 1. Left Main: 0.99; low likelihood of hemodynamic significance. 2. Prox LAD: 0.91; low likelihood of hemodynamic significance. 3. D1: 0.86; low likelihood of hemodynamic significance. 4. LCX: 0.94; low likelihood of hemodynamic significance. 5. RCA: 0.96; low likelihood of  hemodynamic significance. IMPRESSION: 1.  CT FFR analysis didn't show any significant stenosis. Lennie Odor, MD Electronically Signed   By: Lennie Odor M.D.   On: 04/02/2022 14:45    Assessment & Plan:  Hyperlipidemia with target LDL less than 130 -     Lipid panel; Future  Essential hypertension, benign -     EKG 12-Lead -     Basic metabolic panel; Future -     CBC with Differential/Platelet; Future -     TSH; Future -     Cortisol; Future  Hypotension, unspecified hypotension type -     EKG 12-Lead -     Basic metabolic panel; Future -     CBC with Differential/Platelet; Future -     TSH; Future -     Cortisol; Future  DOE (dyspnea on exertion) -     EKG 12-Lead -     Troponin I (High Sensitivity); Future     Follow-up: Return in about 4 months (around 05/03/2023).  Sanda Linger, MD

## 2022-12-31 NOTE — Patient Instructions (Signed)
Hypertension, Adult High blood pressure (hypertension) is when the force of blood pumping through the arteries is too strong. The arteries are the blood vessels that carry blood from the heart throughout the body. Hypertension forces the heart to work harder to pump blood and may cause arteries to become narrow or stiff. Untreated or uncontrolled hypertension can lead to a heart attack, heart failure, a stroke, kidney disease, and other problems. A blood pressure reading consists of a higher number over a lower number. Ideally, your blood pressure should be below 120/80. The first ("top") number is called the systolic pressure. It is a measure of the pressure in your arteries as your heart beats. The second ("bottom") number is called the diastolic pressure. It is a measure of the pressure in your arteries as the heart relaxes. What are the causes? The exact cause of this condition is not known. There are some conditions that result in high blood pressure. What increases the risk? Certain factors may make you more likely to develop high blood pressure. Some of these risk factors are under your control, including: Smoking. Not getting enough exercise or physical activity. Being overweight. Having too much fat, sugar, calories, or salt (sodium) in your diet. Drinking too much alcohol. Other risk factors include: Having a personal history of heart disease, diabetes, high cholesterol, or kidney disease. Stress. Having a family history of high blood pressure and high cholesterol. Having obstructive sleep apnea. Age. The risk increases with age. What are the signs or symptoms? High blood pressure may not cause symptoms. Very high blood pressure (hypertensive crisis) may cause: Headache. Fast or irregular heartbeats (palpitations). Shortness of breath. Nosebleed. Nausea and vomiting. Vision changes. Severe chest pain, dizziness, and seizures. How is this diagnosed? This condition is diagnosed by  measuring your blood pressure while you are seated, with your arm resting on a flat surface, your legs uncrossed, and your feet flat on the floor. The cuff of the blood pressure monitor will be placed directly against the skin of your upper arm at the level of your heart. Blood pressure should be measured at least twice using the same arm. Certain conditions can cause a difference in blood pressure between your right and left arms. If you have a high blood pressure reading during one visit or you have normal blood pressure with other risk factors, you may be asked to: Return on a different day to have your blood pressure checked again. Monitor your blood pressure at home for 1 week or longer. If you are diagnosed with hypertension, you may have other blood or imaging tests to help your health care provider understand your overall risk for other conditions. How is this treated? This condition is treated by making healthy lifestyle changes, such as eating healthy foods, exercising more, and reducing your alcohol intake. You may be referred for counseling on a healthy diet and physical activity. Your health care provider may prescribe medicine if lifestyle changes are not enough to get your blood pressure under control and if: Your systolic blood pressure is above 130. Your diastolic blood pressure is above 80. Your personal target blood pressure may vary depending on your medical conditions, your age, and other factors. Follow these instructions at home: Eating and drinking  Eat a diet that is high in fiber and potassium, and low in sodium, added sugar, and fat. An example of this eating plan is called the DASH diet. DASH stands for Dietary Approaches to Stop Hypertension. To eat this way: Eat   plenty of fresh fruits and vegetables. Try to fill one half of your plate at each meal with fruits and vegetables. Eat whole grains, such as whole-wheat pasta, brown rice, or whole-grain bread. Fill about one  fourth of your plate with whole grains. Eat or drink low-fat dairy products, such as skim milk or low-fat yogurt. Avoid fatty cuts of meat, processed or cured meats, and poultry with skin. Fill about one fourth of your plate with lean proteins, such as fish, chicken without skin, beans, eggs, or tofu. Avoid pre-made and processed foods. These tend to be higher in sodium, added sugar, and fat. Reduce your daily sodium intake. Many people with hypertension should eat less than 1,500 mg of sodium a day. Do not drink alcohol if: Your health care provider tells you not to drink. You are pregnant, may be pregnant, or are planning to become pregnant. If you drink alcohol: Limit how much you have to: 0-1 drink a day for women. 0-2 drinks a day for men. Know how much alcohol is in your drink. In the [place] one drink equals one 12 oz bottle of beer (355 mL), one 5 oz glass of wine (148 mL), or one 1 oz glass of hard liquor (44 mL). Lifestyle  Work with your health care provider to maintain a healthy body weight or to lose weight. Ask what an ideal weight is for you. Get at least 30 minutes of exercise that causes your heart to beat faster (aerobic exercise) most days of the week. Activities may include walking, swimming, or biking. Include exercise to strengthen your muscles (resistance exercise), such as Pilates or lifting weights, as part of your weekly exercise routine. Try to do these types of exercises for 30 minutes at least 3 days a week. Do not use any products that contain nicotine or tobacco. These products include cigarettes, chewing tobacco, and vaping devices, such as e-cigarettes. If you need help quitting, ask your health care provider. Monitor your blood pressure at home as told by your health care provider. Keep all follow-up visits. This is important. Medicines Take over-the-counter and prescription medicines only as told by your health care provider. Follow directions carefully. Blood  pressure medicines must be taken as prescribed. Do not skip doses of blood pressure medicine. Doing this puts you at risk for problems and can make the medicine less effective. Ask your health care provider about side effects or reactions to medicines that you should watch for. Contact a health care provider if you: Think you are having a reaction to a medicine you are taking. Have headaches that keep coming back (recurring). Feel dizzy. Have swelling in your ankles. Have trouble with your vision. Get help right away if you: Develop a severe headache or confusion. Have unusual weakness or numbness. Feel faint. Have severe pain in your chest or abdomen. Vomit repeatedly. Have trouble breathing. These symptoms may be an emergency. Get help right away. Call 911. Do not wait to see if the symptoms will go away. Do not drive yourself to the hospital. Summary Hypertension is when the force of blood pumping through your arteries is too strong. If this condition is not controlled, it may put you at risk for serious complications. Your personal target blood pressure may vary depending on your medical conditions, your age, and other factors. For most people, a normal blood pressure is less than 120/80. Hypertension is treated with lifestyle changes, medicines, or a combination of both. Lifestyle changes include losing weight, eating a healthy,   low-sodium diet, exercising more, and limiting alcohol. This information is not intended to replace advice given to you by your health care provider. Make sure you discuss any questions you have with your health care provider. Document Revised: 06/05/2021 Document Reviewed: 06/05/2021 Elsevier Patient Education  2023 Elsevier Inc.  

## 2023-01-01 MED ORDER — IRBESARTAN 150 MG PO TABS: 150.0000 mg | ORAL_TABLET | Freq: Every day | ORAL | 0 refills | Status: DC

## 2023-03-16 NOTE — Progress Notes (Deleted)
Cardiology Office Note:   Date:  03/16/2023  NAME:  Blake Anderson    MRN: [id number] DOB:  1956/01/08   PCP:  Etta Grandchild, MD  Cardiologist:  None  Electrophysiologist:  None   Referring MD: Etta Grandchild, MD   No chief complaint on file.   History of Present Illness:   Blake Anderson is a 67 y.o. male with a hx of CAD, HLD, HTN who presents for follow-up.   Problem List CAD -CAC 1286 (93rd percentile) -moderate proximal LAD (50-69%) CT FFR 0.91 2. HLD -T chol 119, HDL 38, LDL 56, TG 129 3. HTN  Past Medical History: Past Medical History:  Diagnosis Date   GERD (gastroesophageal reflux disease)    Hyperlipidemia    Hypertension     Past Surgical History: No past surgical history on file.  Current Medications: No outpatient medications have been marked as taking for the 03/17/23 encounter (Appointment) with O'Neal, Ronnald Ramp, MD.     Allergies:    Lisinopril   Social History: Social History   Socioeconomic History   Marital status: Single    Spouse name: Not on file   Number of children: Not on file   Years of education: Not on file   Highest education level: Some college, no degree  Occupational History   Occupation: Retired  Tobacco Use   Smoking status: Never   Smokeless tobacco: Never  Substance and Sexual Activity   Alcohol use: No   Drug use: No   Sexual activity: Not Currently  Other Topics Concern   Not on file  Social History Narrative   Not on file   Social Determinants of Health   Financial Resource Strain: Low Risk  (12/30/2022)   Overall Financial Resource Strain (CARDIA)    Difficulty of Paying Living Expenses: Not hard at all  Food Insecurity: No Food Insecurity (12/30/2022)   Hunger Vital Sign    Worried About Running Out of Food in the Last Year: Never true    Ran Out of Food in the Last Year: Never true  Transportation Needs: No Transportation Needs (12/30/2022)   PRAPARE - Scientist, research (physical sciences) (Medical): No    Lack of Transportation (Non-Medical): No  Physical Activity: Inactive (12/30/2022)   Exercise Vital Sign    Days of Exercise per Week: 0 days    Minutes of Exercise per Session: 0 min  Stress: No Stress Concern Present (12/30/2022)   [employer] of Occupational Health - Occupational Stress Questionnaire    Feeling of Stress : Only a little  Social Connections: Socially Isolated (12/30/2022)   Social Connection and Isolation Panel [NHANES]    Frequency of Communication with Friends and Family: Three times a week    Frequency of Social Gatherings with Friends and Family: Twice a week    Attends Religious Services: Never    Database administrator or Organizations: No    Attends Engineer, structural: Never    Marital Status: Never married     Family History: The patient's family history includes Alcohol abuse in his father; Arthritis in his mother. There is no history of Asthma, Cancer, COPD, Diabetes, Heart disease, Hyperlipidemia, Hypertension, Kidney disease, or Stroke.  ROS:   All other ROS reviewed and negative. Pertinent positives noted in the HPI.     EKGs/Labs/Other Studies Reviewed:   The following studies were personally reviewed by me today:  EKG:  EKG is *** ordered today.  Recent Labs: 12/31/2022: BUN 12; Creatinine, Ser 0.96; Hemoglobin 17.0; Platelets 270.0; Potassium 4.4; Sodium 134; TSH 3.94   Recent Lipid Panel    Component Value Date/Time   CHOL 119 12/31/2022 1455   TRIG 129.0 12/31/2022 1455   HDL 38.00 (L) 12/31/2022 1455   CHOLHDL 3 12/31/2022 1455   VLDL 25.8 12/31/2022 1455   LDLCALC 56 12/31/2022 1455   LDLCALC 64 05/11/2020 0200   LDLDIRECT 70.0 02/11/2017 1005    Physical Exam:   VS:  There were no vitals taken for this visit.   Wt Readings from Last 3 Encounters:  12/31/22 224 lb (101.6 kg)  10/09/22 217 lb (98.4 kg)  03/14/22 217 lb (98.4 kg)    General: Well nourished, well developed, in  no acute distress Head: Atraumatic, normal size  Eyes: PEERLA, EOMI  Neck: Supple, no JVD Endocrine: No thryomegaly Cardiac: Normal S1, S2; RRR; no murmurs, rubs, or gallops Lungs: Clear to auscultation bilaterally, no wheezing, rhonchi or rales  Abd: Soft, nontender, no hepatomegaly  Ext: No edema, pulses 2+ Musculoskeletal: No deformities, BUE and BLE strength normal and equal Skin: Warm and dry, no rashes   Neuro: Alert and oriented to person, place, time, and situation, CNII-XII grossly intact, no focal deficits  Psych: Normal mood and affect   ASSESSMENT:   Blake Anderson is a 67 y.o. male who presents for the following: No diagnosis found.  PLAN:   There are no diagnoses linked to this encounter.  {Are you ordering a CV Procedure (e.g. stress test, cath, DCCV, TEE, etc)?   Press F2        :[id number]}  Disposition: No follow-ups on file.  Medication Adjustments/Labs and Tests Ordered: Current medicines are reviewed at length with the patient today.  Concerns regarding medicines are outlined above.  No orders of the defined types were placed in this encounter.  No orders of the defined types were placed in this encounter.  There are no Patient Instructions on file for this visit.   Time Spent with Patient: I have spent a total of *** minutes with patient reviewing hospital notes, telemetry, EKGs, labs and examining the patient as well as establishing an assessment and plan that was discussed with the patient.  > 50% of time was spent in direct patient care.  Signed, Lenna Gilford. Flora Lipps, MD, [facility]  [facility]  [address] [place], [place] [address] [phone]  03/16/2023 8:20 PM

## 2023-03-17 ENCOUNTER — Ambulatory Visit: Payer: Medicare Other | Admitting: Cardiovascular Disease

## 2023-03-17 DIAGNOSIS — I150 Renovascular hypertension: Secondary | ICD-10-CM

## 2023-03-17 DIAGNOSIS — E782 Mixed hyperlipidemia: Secondary | ICD-10-CM

## 2023-03-17 DIAGNOSIS — I2510 Atherosclerotic heart disease of native coronary artery without angina pectoris: Secondary | ICD-10-CM

## 2023-03-25 NOTE — Progress Notes (Unsigned)
Cardiology Office Note:   Date:  03/26/2023  NAME:  Blake Anderson    MRN: [id number] DOB:  1956-01-29   PCP:  Etta Grandchild, MD  Cardiologist:  None  Electrophysiologist:  None   Referring MD: Etta Grandchild, MD   Chief Complaint  Patient presents with   Follow-up    History of Present Illness:   Blake Anderson is a 67 y.o. male with a hx of CAD, HLD who presents for follow-up.  He reports he is getting more short of breath with activity.  He tells me when he does heavy exertion he can get quite winded.  He tells me he went on a recent road trip.  Went to baseball games.  Reports he did a lot of walking and got short of breath.  He is not active most days.  Not exercising.  We did discuss this will help.  Reports no chest pain or pressure.  Blood pressure slightly elevated.  On 150 mg of Avapro.  Working with his primary care physician on this.  EKG in May was normal.  LDL cholesterol at goal.  He is not diabetic.  CV exam unremarkable today.  We did discuss that his shortness of breath is likely related to deconditioning.  His echo was normal.  He had no obstructive disease by CT FFR.  Problem List CAD -CAC 1286 (93rd percentile) -moderate pLAD 50-69% (CT FFR negative) 2. HLD -T chol 129, HDL 38, LDL 56, TG 129 3. HTN  Past Medical History: Past Medical History:  Diagnosis Date   GERD (gastroesophageal reflux disease)    Hyperlipidemia    Hypertension     Past Surgical History: History reviewed. No pertinent surgical history.  Current Medications: Current Meds  Medication Sig   aspirin 81 MG tablet Take 81 mg by mouth daily.   cholecalciferol (VITAMIN D3) 25 MCG (1000 UT) tablet Take 1,000 Units by mouth daily.   irbesartan (AVAPRO) 150 MG tablet Take 1 tablet (150 mg total) by mouth daily.   ketoconazole (NIZORAL) 2 % cream Apply 1 Application topically 2 (two) times daily.   rosuvastatin (CRESTOR) 40 MG tablet Take 1 tablet (40 mg total) by mouth daily.      Allergies:    Lisinopril   Social History: Social History   Socioeconomic History   Marital status: Single    Spouse name: Not on file   Number of children: Not on file   Years of education: Not on file   Highest education level: Some college, no degree  Occupational History   Occupation: Retired  Tobacco Use   Smoking status: Never   Smokeless tobacco: Never  Substance and Sexual Activity   Alcohol use: No   Drug use: No   Sexual activity: Not Currently  Other Topics Concern   Not on file  Social History Narrative   Not on file   Social Determinants of Health   Financial Resource Strain: Low Risk  (12/30/2022)   Overall Financial Resource Strain (CARDIA)    Difficulty of Paying Living Expenses: Not hard at all  Food Insecurity: No Food Insecurity (12/30/2022)   Hunger Vital Sign    Worried About Running Out of Food in the Last Year: Never true    Ran Out of Food in the Last Year: Never true  Transportation Needs: No Transportation Needs (12/30/2022)   PRAPARE - Administrator, Civil Service (Medical): No    Lack of Transportation (Non-Medical): No  Physical  Activity: Inactive (12/30/2022)   Exercise Vital Sign    Days of Exercise per Week: 0 days    Minutes of Exercise per Session: 0 min  Stress: No Stress Concern Present (12/30/2022)   [employer] of Occupational Health - Occupational Stress Questionnaire    Feeling of Stress : Only a little  Social Connections: Socially Isolated (12/30/2022)   Social Connection and Isolation Panel [NHANES]    Frequency of Communication with Friends and Family: Three times a week    Frequency of Social Gatherings with Friends and Family: Twice a week    Attends Religious Services: Never    Database administrator or Organizations: No    Attends Engineer, structural: Never    Marital Status: Never married     Family History: The patient's family history includes Alcohol abuse in his father; Arthritis in  his mother. There is no history of Asthma, Cancer, COPD, Diabetes, Heart disease, Hyperlipidemia, Hypertension, Kidney disease, or Stroke.  ROS:   All other ROS reviewed and negative. Pertinent positives noted in the HPI.     EKGs/Labs/Other Studies Reviewed:   The following studies were personally reviewed by me today:  EKG:  EKG is not ordered today.   EKG dated 12/31/2022 demonstrates normal sinus rhythm with no acute ischemic changes.      Recent Labs: 12/31/2022: BUN 12; Creatinine, Ser 0.96; Hemoglobin 17.0; Platelets 270.0; Potassium 4.4; Sodium 134; TSH 3.94   Recent Lipid Panel    Component Value Date/Time   CHOL 119 12/31/2022 1455   TRIG 129.0 12/31/2022 1455   HDL 38.00 (L) 12/31/2022 1455   CHOLHDL 3 12/31/2022 1455   VLDL 25.8 12/31/2022 1455   LDLCALC 56 12/31/2022 1455   LDLCALC 64 05/11/2020 0200   LDLDIRECT 70.0 02/11/2017 1005    Physical Exam:   VS:  BP 130/88 (BP Location: Left Arm, Patient Position: Sitting, Cuff Size: Normal)   Pulse 95   Ht 5\' 2"  (1.575 m)   Wt 221 lb 6.4 oz (100.4 kg)   SpO2 99%   BMI 40.49 kg/m    Wt Readings from Last 3 Encounters:  03/26/23 221 lb 6.4 oz (100.4 kg)  12/31/22 224 lb (101.6 kg)  10/09/22 217 lb (98.4 kg)    General: Well nourished, well developed, in no acute distress Head: Atraumatic, normal size  Eyes: PEERLA, EOMI  Neck: Supple, no JVD Endocrine: No thryomegaly Cardiac: Normal S1, S2; RRR; no murmurs, rubs, or gallops Lungs: Clear to auscultation bilaterally, no wheezing, rhonchi or rales  Abd: Soft, nontender, no hepatomegaly  Ext: No edema, pulses 2+ Musculoskeletal: No deformities, BUE and BLE strength normal and equal Skin: Warm and dry, no rashes   Neuro: Alert and oriented to person, place, time, and situation, CNII-XII grossly intact, no focal deficits  Psych: Normal mood and affect   ASSESSMENT:   [name] is a 67 y.o. male who presents for the following: 1. Coronary artery disease  involving native coronary artery of native heart without angina pectoris   2. SOB (shortness of breath) on exertion   3. Mixed hyperlipidemia   4. Primary hypertension   5. Obesity, morbid, BMI 40.0-49.9 (HCC)     PLAN:   1. Coronary artery disease involving native coronary artery of native heart without angina pectoris 2. SOB (shortness of breath) on exertion 3. Mixed hyperlipidemia -Nonobstructive CAD AD by CT FFR.  Elevated coronary calcium score.  LDL at goal.  Echo normal.  On aspirin.  Reports shortness of breath with activity.  Likely related to deconditioning.  No chest pain or pressure.  I recommended regular exercise.  Hopefully this will improve.  All of his numbers are at goal.  He needs to lose weight.  See discussion on blood pressure below.  4. Primary hypertension -Continue current medications.  BP goal less than 130/80.  Follow-up with primary care physician who is managing this.  5. Obesity, morbid, BMI 40.0-49.9 (HCC) -Diet and exercise recommended.  Disposition: Return in about 1 year (around 03/25/2024).  Medication Adjustments/Labs and Tests Ordered: Current medicines are reviewed at length with the patient today.  Concerns regarding medicines are outlined above.  No orders of the defined types were placed in this encounter.  No orders of the defined types were placed in this encounter.  Patient Instructions  Medication Instructions:  Your physician recommends that you continue on your current medications as directed. Please refer to the Current Medication list given to you today.   *If you need a refill on your cardiac medications before your next appointment, please call your pharmacy*    Follow-Up: At [facility], you and your health needs are our priority.  As part of our continuing mission to provide you with exceptional heart care, we have created designated Provider Care Teams.  These Care Teams include your primary Cardiologist (physician) and  Advanced Practice Providers (APPs -  Physician Assistants and Nurse Practitioners) who all work together to provide you with the care you need, when you need it.     Your next appointment:   12 month(s)  The format for your next appointment:   In Person  Provider:  Edd Fabian, FNP, Micah Flesher, PA-C, Marjie Skiff, PA-C, Juanda Crumble, PA-C, Joni Reining, DNP, ANP, Azalee Course, PA-C, or Bernadene Person, NP       Time Spent with Patient: I have spent a total of 25 minutes with patient reviewing hospital notes, telemetry, EKGs, labs and examining the patient as well as establishing an assessment and plan that was discussed with the patient.  > 50% of time was spent in direct patient care.  Signed, Lenna Gilford. Flora Lipps, MD, [facility]  [facility]  [address], [address] [place], [place] [address] [phone]  03/26/2023 11:04 AM

## 2023-03-26 ENCOUNTER — Encounter: Payer: Self-pay | Admitting: Cardiovascular Disease

## 2023-03-26 ENCOUNTER — Other Ambulatory Visit: Payer: Self-pay | Admitting: Internal Medicine

## 2023-03-26 ENCOUNTER — Ambulatory Visit: Payer: Medicare Other | Attending: Cardiovascular Disease | Admitting: Cardiovascular Disease

## 2023-03-26 VITALS — BP 130/88 | HR 95 | Ht 62.0 in | Wt 221.4 lb

## 2023-03-26 DIAGNOSIS — E782 Mixed hyperlipidemia: Secondary | ICD-10-CM

## 2023-03-26 DIAGNOSIS — R0602 Shortness of breath: Secondary | ICD-10-CM | POA: Diagnosis not present

## 2023-03-26 DIAGNOSIS — I10 Essential (primary) hypertension: Secondary | ICD-10-CM | POA: Diagnosis not present

## 2023-03-26 DIAGNOSIS — E669 Obesity, unspecified: Secondary | ICD-10-CM

## 2023-03-26 DIAGNOSIS — I2510 Atherosclerotic heart disease of native coronary artery without angina pectoris: Secondary | ICD-10-CM | POA: Diagnosis not present

## 2023-03-26 NOTE — Patient Instructions (Addendum)
Medication Instructions:  Your physician recommends that you continue on your current medications as directed. Please refer to the Current Medication list given to you today.   *If you need a refill on your cardiac medications before your next appointment, please call your pharmacy*    Follow-Up: At [facility], you and your health needs are our priority.  As part of our continuing mission to provide you with exceptional heart care, we have created designated Provider Care Teams.  These Care Teams include your primary Cardiologist (physician) and Advanced Practice Providers (APPs -  Physician Assistants and Nurse Practitioners) who all work together to provide you with the care you need, when you need it.     Your next appointment:   12 month(s)  The format for your next appointment:   In Person  Provider:  Edd Fabian, FNP, Micah Flesher, PA-C, Marjie Skiff, PA-C, Juanda Crumble, PA-C, Joni Reining, DNP, ANP, Azalee Course, PA-C, or Bernadene Person, NP

## 2023-03-31 ENCOUNTER — Encounter: Payer: Self-pay | Admitting: Internal Medicine

## 2023-03-31 ENCOUNTER — Ambulatory Visit (INDEPENDENT_AMBULATORY_CARE_PROVIDER_SITE_OTHER): Payer: Medicare Other | Admitting: Internal Medicine

## 2023-03-31 VITALS — BP 138/86 | HR 96 | Temp 97.9°F | Resp 16 | Ht 62.0 in | Wt 218.0 lb

## 2023-03-31 DIAGNOSIS — I10 Essential (primary) hypertension: Secondary | ICD-10-CM | POA: Diagnosis not present

## 2023-03-31 DIAGNOSIS — Z0000 Encounter for general adult medical examination without abnormal findings: Secondary | ICD-10-CM

## 2023-03-31 DIAGNOSIS — N400 Enlarged prostate without lower urinary tract symptoms: Secondary | ICD-10-CM

## 2023-03-31 DIAGNOSIS — E785 Hyperlipidemia, unspecified: Secondary | ICD-10-CM | POA: Diagnosis not present

## 2023-03-31 LAB — URINALYSIS, ROUTINE W REFLEX MICROSCOPIC
Hgb urine dipstick: NEGATIVE
Leukocytes,Ua: NEGATIVE
Nitrite: NEGATIVE
RBC / HPF: NONE SEEN (ref 0–?)
Specific Gravity, Urine: 1.025 (ref 1.000–1.030)
Total Protein, Urine: NEGATIVE
Urine Glucose: NEGATIVE
Urobilinogen, UA: 0.2 (ref 0.0–1.0)
pH: 6 (ref 5.0–8.0)

## 2023-03-31 LAB — CBC WITH DIFFERENTIAL/PLATELET
Basophils Absolute: 0.2 10*3/uL — ABNORMAL HIGH (ref 0.0–0.1)
Basophils Relative: 1.7 % (ref 0.0–3.0)
Eosinophils Absolute: 0.3 10*3/uL (ref 0.0–0.7)
Eosinophils Relative: 3 % (ref 0.0–5.0)
HCT: 52.3 % — ABNORMAL HIGH (ref 39.0–52.0)
Hemoglobin: 17.1 g/dL — ABNORMAL HIGH (ref 13.0–17.0)
Lymphocytes Relative: 20.5 % (ref 12.0–46.0)
Lymphs Abs: 1.8 10*3/uL (ref 0.7–4.0)
MCHC: 32.7 g/dL (ref 30.0–36.0)
MCV: 90.9 fl (ref 78.0–100.0)
Monocytes Absolute: 0.9 10*3/uL (ref 0.1–1.0)
Monocytes Relative: 10.3 % (ref 3.0–12.0)
Neutro Abs: 5.7 10*3/uL (ref 1.4–7.7)
Neutrophils Relative %: 64.5 % (ref 43.0–77.0)
Platelets: 291 10*3/uL (ref 150.0–400.0)
RBC: 5.76 Mil/uL (ref 4.22–5.81)
RDW: 13.9 % (ref 11.5–15.5)
WBC: 8.9 10*3/uL (ref 4.0–10.5)

## 2023-03-31 LAB — BASIC METABOLIC PANEL
BUN: 8 mg/dL (ref 6–23)
CO2: 19 mEq/L (ref 19–32)
Calcium: 9.6 mg/dL (ref 8.4–10.5)
Chloride: 103 mEq/L (ref 96–112)
Creatinine, Ser: 1.01 mg/dL (ref 0.40–1.50)
GFR: 76.86 mL/min (ref >60.00)
Glucose, Bld: 99 mg/dL (ref 70–99)
Potassium: 4.2 mEq/L (ref 3.5–5.1)
Sodium: 137 mEq/L (ref 135–145)

## 2023-03-31 LAB — HEPATIC FUNCTION PANEL
ALT: 23 U/L (ref 0–53)
AST: 20 U/L (ref 0–37)
Albumin: 4.4 g/dL (ref 3.5–5.2)
Alkaline Phosphatase: 81 U/L (ref 39–117)
Bilirubin, Direct: 0.2 mg/dL (ref 0.0–0.3)
Total Bilirubin: 0.9 mg/dL (ref 0.2–1.2)
Total Protein: 7.1 g/dL (ref 6.0–8.3)

## 2023-03-31 LAB — PSA: PSA: 0.78 ng/mL (ref 0.10–4.00)

## 2023-03-31 MED ORDER — IRBESARTAN 150 MG PO TABS: 150.0000 mg | ORAL_TABLET | Freq: Every day | ORAL | 1 refills | Status: DC

## 2023-03-31 NOTE — Progress Notes (Signed)
Subjective:  Patient ID: Blake Anderson, male    DOB: Dec 29, 1955  Age: 67 y.o. MRN: [id number]  CC: Annual Exam, Hypertension, and Hyperlipidemia   HPI Blake Anderson presents for a CPX and f/up ----  He complains of polyuria and nocturia.  He is active and denies chest pain, shortness of breath, diaphoresis, or edema.  Outpatient Medications Prior to Visit  Medication Sig Dispense Refill   aspirin 81 MG tablet Take 81 mg by mouth daily.     cholecalciferol (VITAMIN D3) 25 MCG (1000 UT) tablet Take 1,000 Units by mouth daily.     ketoconazole (NIZORAL) 2 % cream Apply 1 Application topically 2 (two) times daily. 60 g 3   rosuvastatin (CRESTOR) 40 MG tablet Take 1 tablet (40 mg total) by mouth daily. 90 tablet 1   irbesartan (AVAPRO) 150 MG tablet Take 1 tablet (150 mg total) by mouth daily. 90 tablet 0   No facility-administered medications prior to visit.    ROS Review of Systems  Constitutional: Negative.  Negative for diaphoresis and fatigue.  HENT: Negative.    Eyes: Negative.   Respiratory: Negative.  Negative for cough, chest tightness, shortness of breath and wheezing.   Cardiovascular:  Negative for chest pain, palpitations and leg swelling.  Gastrointestinal:  Negative for abdominal pain, constipation, diarrhea, nausea and vomiting.  Genitourinary:  Positive for difficulty urinating and frequency.       Nocturia 2 times per night  Musculoskeletal: Negative.  Negative for arthralgias, back pain, myalgias and neck pain.  Skin: Negative.  Negative for color change and pallor.  Neurological:  Negative for dizziness, weakness and headaches.  Hematological:  Negative for adenopathy. Does not bruise/bleed easily.  Psychiatric/Behavioral: Negative.      Objective:  BP 138/86 (BP Location: Right Arm, Patient Position: Sitting, Cuff Size: Large)   Pulse 96   Temp 97.9 F (36.6 C) (Oral)   Resp 16   Ht 5\' 2"  (1.575 m)   Wt 218 lb (98.9 kg)   SpO2 99%   BMI  39.87 kg/m   BP Readings from Last 3 Encounters:  03/31/23 138/86  03/26/23 130/88  12/31/22 114/72    Wt Readings from Last 3 Encounters:  03/31/23 218 lb (98.9 kg)  03/26/23 221 lb 6.4 oz (100.4 kg)  12/31/22 224 lb (101.6 kg)    Physical Exam Vitals reviewed.  Constitutional:      Appearance: Normal appearance.  HENT:     Mouth/Throat:     Mouth: Mucous membranes are moist.  Eyes:     General: No scleral icterus.    Conjunctiva/sclera: Conjunctivae normal.  Cardiovascular:     Rate and Rhythm: Normal rate and regular rhythm.     Pulses: Normal pulses.     Heart sounds: No murmur heard.    No gallop.  Pulmonary:     Effort: No respiratory distress.     Breath sounds: No stridor. No wheezing, rhonchi or rales.  Abdominal:     General: Abdomen is protuberant. There is no distension.     Palpations: There is no mass.     Tenderness: There is no abdominal tenderness. There is no guarding.     Hernia: No hernia is present.  Genitourinary:    Pubic Area: No rash.      Penis: Normal.      Testes: Normal.     Prostate: Enlarged. Not tender and no nodules present.     Rectum: Normal. Guaiac result negative. No mass,  tenderness, anal fissure, external hemorrhoid or internal hemorrhoid. Normal anal tone.  Musculoskeletal:        General: Normal range of motion.     Cervical back: Neck supple.     Right lower leg: No edema.     Left lower leg: No edema.  Lymphadenopathy:     Cervical: No cervical adenopathy.  Skin:    General: Skin is warm and dry.  Neurological:     General: No focal deficit present.     Mental Status: He is alert. Mental status is at baseline.  Psychiatric:        Mood and Affect: Mood normal.     Lab Results  Component Value Date   WBC 8.9 03/31/2023   HGB 17.1 (H) 03/31/2023   HCT 52.3 (H) 03/31/2023   PLT 291.0 03/31/2023   GLUCOSE 99 03/31/2023   CHOL 119 12/31/2022   TRIG 129.0 12/31/2022   HDL 38.00 (L) 12/31/2022   LDLDIRECT 70.0  02/11/2017   LDLCALC 56 12/31/2022   ALT 23 03/31/2023   AST 20 03/31/2023   NA 137 03/31/2023   K 4.2 03/31/2023   CL 103 03/31/2023   CREATININE 1.01 03/31/2023   BUN 8 03/31/2023   CO2 19 03/31/2023   TSH 3.94 12/31/2022   PSA 0.78 03/31/2023   HGBA1C 5.7 02/11/2017    CT CORONARY MORPH W/CTA COR W/SCORE W/CA W/CM &/OR WO/CM  Addendum Date: 04/02/2022   ADDENDUM REPORT: 04/02/2022 16:11 EXAM: OVER-READ INTERPRETATION  CT CHEST The following report is a limited chest CT over-read performed by radiologist Dr. Irma Newness [facility] on 04/02/2022. This over-read does not include interpretation of cardiac or coronary anatomy or pathology. The coronary CTA interpretation by the cardiologist is attached. COMPARISON:  Cardiac CT 02/22/2022 FINDINGS: Mediastinum/Nodes: No enlarged lymph nodes within the visualized mediastinum.No significant extracardiac vascular findings. Lungs/Pleura: There is no pleural effusion. The visualized lungs remain clear. Upper abdomen: No significant findings in the visualized upper abdomen. Musculoskeletal/Chest wall: No chest wall mass or suspicious osseous findings within the visualized chest. IMPRESSION: No significant extracardiac findings within the visualized chest. Electronically Signed   By: Carey Bullocks M.D.   On: 04/02/2022 16:11   Result Date: 04/02/2022 CLINICAL DATA:  Chest pain EXAM: Cardiac/Coronary CTA TECHNIQUE: A non-contrast, gated CT scan was obtained with axial slices of 3 mm through the heart for calcium scoring. Calcium scoring was performed using the Agatston method. A 120 kV prospective, gated, contrast cardiac scan was obtained. Gantry rotation speed was 250 msecs and collimation was 0.6 mm. Two sublingual nitroglycerin tablets (0.8 mg) were given. The 3D data set was reconstructed in 5% intervals of the 35-75% of the R-R cycle. Diastolic phases were analyzed on a dedicated workstation using MPR, MIP, and VRT modes. The  patient received 95 cc of contrast. FINDINGS: Image quality: Excellent. Noise artifact is: Limited. Coronary Arteries:  Normal coronary origin.  Right dominance. Left main: The left main is a large caliber vessel with a normal take off from the left coronary cusp that trifurcates into a LAD, LCX, and ramus intermedius. There is minimal calcified plaque (<25%). Left anterior descending artery: The proximal LAD contains moderate mixed density plaque (50-69%). The mid and distal segments are patent. A mid LAD myocardial bridge is present (normal variant). The first diagonal contains mild mixed density plaque (25-49%). The second diagonal is patent. Ramus intermedius: Too small for analysis. Left circumflex artery: The LCX is non-dominant. The proximal LCX contains mild calcified  plaque (25-49%). The LCX gives off 1 patent OM branch. Right coronary artery: The RCA is dominant with normal take off from the right coronary cusp. There is minimal diffuse mixed density plaque (<25%). The RCA terminates as a PDA and right posterolateral branch without evidence of plaque or stenosis. Right Atrium: Right atrial size is within normal limits. Right Ventricle: The right ventricular cavity is within normal limits. Left Atrium: Left atrial size is normal in size with no left atrial appendage filling defect. Small PFO. Left Ventricle: The ventricular cavity size is within normal limits. Pulmonary arteries: Normal in size without proximal filling defect. Pulmonary veins: Normal pulmonary venous drainage. Pericardium: Normal thickness without significant effusion or calcium present. Prominent pericardial fat. Cardiac valves: The aortic valve is trileaflet without significant calcification. The mitral valve is normal without significant calcification. Aorta: Normal caliber without significant disease. Extra-cardiac findings: See attached radiology report for non-cardiac structures. IMPRESSION: 1. Coronary calcium score of 1286. This was  93rd percentile for age-, sex, and race-matched controls. 2. Normal coronary origin with right dominance. 3. Moderate mixed density plaque (50-69%) in the proximal LAD. 4. Mild calcified plaque (25-49%) in the LCX. 5. Minimal mixed density plaque (<25%) in the RCA. 6. Mid LAD myocardial bridge (normal). 7. Small PFO. RECOMMENDATIONS: 1. Moderate stenosis. Consider symptom-guided anti-ischemic pharmacotherapy as well as risk factor modification per guideline directed care. Additional analysis with CT FFR will be submitted. Lennie Odor, MD Electronically Signed: By: Lennie Odor M.D. On: 04/02/2022 14:38   CT CORONARY FRACTIONAL FLOW RESERVE DATA PREP  Result Date: 04/02/2022 EXAM: CT FFR analysis was performed on the original cardiac CTA dataset. Diagrammatic representation of the CT FFR analysis is provided in a separate PDF document in PACS. This dictation was created using the PDF document and an interactive 3D model of the results. The 3D model is not available in the EMR/PACS. INTERPRETATION: CT FFR provides simultaneous calculation of pressure and flow across the entire coronary tree. For clinical decision making, CT FFR values should be obtained 1-2 cm distal to the lower border of each stenosis measured. Coronary CTA-related artifacts may impair the diagnostic accuracy of the original cardiac CTA and FFR CT results. *Due to the fact that CT FFR represents a mathematically-derived analysis, it is recommended that the results be interpreted as follows: 1. CT FFR >0.80: Low likelihood of hemodynamic significance. 2. CT FFR 0.76-0.80: Borderline likelihood of hemodynamic significance. 3. CT FFR =< 0.75: High likelihood of hemodynamic significance. *Coronary CT Angiography-derived Fractional Flow Reserve Testing in Patients with Stable Coronary Artery Disease: Recommendations on Interpretation and Reporting. Radiology: Cardiothoracic Imaging. 2019;1(5):e190050 FINDINGS: 1. Left Main: 0.99; low likelihood  of hemodynamic significance. 2. Prox LAD: 0.91; low likelihood of hemodynamic significance. 3. D1: 0.86; low likelihood of hemodynamic significance. 4. LCX: 0.94; low likelihood of hemodynamic significance. 5. RCA: 0.96; low likelihood of hemodynamic significance. IMPRESSION: 1.  CT FFR analysis didn't show any significant stenosis. Lennie Odor, MD Electronically Signed   By: Lennie Odor M.D.   On: 04/02/2022 14:45   CT CORONARY FRACTIONAL FLOW RESERVE FLUID ANALYSIS  Result Date: 04/02/2022 EXAM: CT FFR analysis was performed on the original cardiac CTA dataset. Diagrammatic representation of the CT FFR analysis is provided in a separate PDF document in PACS. This dictation was created using the PDF document and an interactive 3D model of the results. The 3D model is not available in the EMR/PACS. INTERPRETATION: CT FFR provides simultaneous calculation of pressure and flow across the entire coronary  tree. For clinical decision making, CT FFR values should be obtained 1-2 cm distal to the lower border of each stenosis measured. Coronary CTA-related artifacts may impair the diagnostic accuracy of the original cardiac CTA and FFR CT results. *Due to the fact that CT FFR represents a mathematically-derived analysis, it is recommended that the results be interpreted as follows: 1. CT FFR >0.80: Low likelihood of hemodynamic significance. 2. CT FFR 0.76-0.80: Borderline likelihood of hemodynamic significance. 3. CT FFR =< 0.75: High likelihood of hemodynamic significance. *Coronary CT Angiography-derived Fractional Flow Reserve Testing in Patients with Stable Coronary Artery Disease: Recommendations on Interpretation and Reporting. Radiology: Cardiothoracic Imaging. 2019;1(5):e190050 FINDINGS: 1. Left Main: 0.99; low likelihood of hemodynamic significance. 2. Prox LAD: 0.91; low likelihood of hemodynamic significance. 3. D1: 0.86; low likelihood of hemodynamic significance. 4. LCX: 0.94; low likelihood of  hemodynamic significance. 5. RCA: 0.96; low likelihood of hemodynamic significance. IMPRESSION: 1.  CT FFR analysis didn't show any significant stenosis. Lennie Odor, MD Electronically Signed   By: Lennie Odor M.D.   On: 04/02/2022 14:45    Assessment & Plan:   Benign prostatic hyperplasia without lower urinary tract symptoms -     PSA; Future -     Urinalysis, Routine w reflex microscopic; Future  Hyperlipidemia with target LDL less than 130 - LDL goal achieved. Doing well on the statin  -     Basic metabolic panel; Future -     Hepatic function panel; Future  Routine general medical examination at a health care facility - Exam completed, labs reviewed, vaccines reviewed and updated, cancer screenings addressed, pt ed material was given.   Essential hypertension, benign- His BP is adequately well controlled. -     Basic metabolic panel; Future -     CBC with Differential/Platelet; Future -     Irbesartan; Take 1 tablet (150 mg total) by mouth daily.  Dispense: 90 tablet; Refill: 1     Follow-up: Return in about 6 months (around 10/01/2023).  Sanda Linger, MD

## 2023-03-31 NOTE — Patient Instructions (Signed)
Health Maintenance, Male Adopting a healthy lifestyle and getting preventive care are important in promoting health and wellness. Ask your health care provider about: The right schedule for you to have regular tests and exams. Things you can do on your own to prevent diseases and keep yourself healthy. What should I know about diet, weight, and exercise? Eat a healthy diet  Eat a diet that includes plenty of vegetables, fruits, low-fat dairy products, and lean protein. Do not eat a lot of foods that are high in solid fats, added sugars, or sodium. Maintain a healthy weight Body mass index (BMI) is a measurement that can be used to identify possible weight problems. It estimates body fat based on height and weight. Your health care provider can help determine your BMI and help you achieve or maintain a healthy weight. Get regular exercise Get regular exercise. This is one of the most important things you can do for your health. Most adults should: Exercise for at least 150 minutes each week. The exercise should increase your heart rate and make you sweat (moderate-intensity exercise). Do strengthening exercises at least twice a week. This is in addition to the moderate-intensity exercise. Spend less time sitting. Even light physical activity can be beneficial. Watch cholesterol and blood lipids Have your blood tested for lipids and cholesterol at 68 years of age, then have this test every 5 years. You may need to have your cholesterol levels checked more often if: Your lipid or cholesterol levels are high. You are older than 68 years of age. You are at high risk for heart disease. What should I know about cancer screening? Many types of cancers can be detected early and may often be prevented. Depending on your health history and family history, you may need to have cancer screening at various ages. This may include screening for: Colorectal cancer. Prostate cancer. Skin cancer. Lung  cancer. What should I know about heart disease, diabetes, and high blood pressure? Blood pressure and heart disease High blood pressure causes heart disease and increases the risk of stroke. This is more likely to develop in people who have high blood pressure readings or are overweight. Talk with your health care provider about your target blood pressure readings. Have your blood pressure checked: Every 3-5 years if you are 18-39 years of age. Every year if you are 40 years old or older. If you are between the ages of 65 and 75 and are a current or former smoker, ask your health care provider if you should have a one-time screening for abdominal aortic aneurysm (AAA). Diabetes Have regular diabetes screenings. This checks your fasting blood sugar level. Have the screening done: Once every three years after age 45 if you are at a normal weight and have a low risk for diabetes. More often and at a younger age if you are overweight or have a high risk for diabetes. What should I know about preventing infection? Hepatitis B If you have a higher risk for hepatitis B, you should be screened for this virus. Talk with your health care provider to find out if you are at risk for hepatitis B infection. Hepatitis C Blood testing is recommended for: Everyone born from 1945 through 1965. Anyone with known risk factors for hepatitis C. Sexually transmitted infections (STIs) You should be screened each year for STIs, including gonorrhea and chlamydia, if: You are sexually active and are younger than 68 years of age. You are older than 68 years of age and your   health care provider tells you that you are at risk for this type of infection. Your sexual activity has changed since you were last screened, and you are at increased risk for chlamydia or gonorrhea. Ask your health care provider if you are at risk. Ask your health care provider about whether you are at high risk for HIV. Your health care provider  may recommend a prescription medicine to help prevent HIV infection. If you choose to take medicine to prevent HIV, you should first get tested for HIV. You should then be tested every 3 months for as long as you are taking the medicine. Follow these instructions at home: Alcohol use Do not drink alcohol if your health care provider tells you not to drink. If you drink alcohol: Limit how much you have to 0-2 drinks a day. Know how much alcohol is in your drink. In the [place] one drink equals one 12 oz bottle of beer (355 mL), one 5 oz glass of wine (148 mL), or one 1 oz glass of hard liquor (44 mL). Lifestyle Do not use any products that contain nicotine or tobacco. These products include cigarettes, chewing tobacco, and vaping devices, such as e-cigarettes. If you need help quitting, ask your health care provider. Do not use street drugs. Do not share needles. Ask your health care provider for help if you need support or information about quitting drugs. General instructions Schedule regular health, dental, and eye exams. Stay current with your vaccines. Tell your health care provider if: You often feel depressed. You have ever been abused or do not feel safe at home. Summary Adopting a healthy lifestyle and getting preventive care are important in promoting health and wellness. Follow your health care provider's instructions about healthy diet, exercising, and getting tested or screened for diseases. Follow your health care provider's instructions on monitoring your cholesterol and blood pressure. This information is not intended to replace advice given to you by your health care provider. Make sure you discuss any questions you have with your health care provider. Document Revised: 12/18/2020 Document Reviewed: 12/18/2020 Elsevier Patient Education  2024 [employer]  

## 2023-06-09 ENCOUNTER — Other Ambulatory Visit: Payer: Self-pay | Admitting: Cardiovascular Disease

## 2023-06-09 DIAGNOSIS — E785 Hyperlipidemia, unspecified: Secondary | ICD-10-CM

## 2023-07-07 ENCOUNTER — Ambulatory Visit: Payer: Medicare Other | Admitting: Cardiovascular Disease

## 2023-09-21 ENCOUNTER — Other Ambulatory Visit: Payer: Self-pay | Admitting: Internal Medicine

## 2023-09-21 DIAGNOSIS — I10 Essential (primary) hypertension: Secondary | ICD-10-CM

## 2023-10-15 ENCOUNTER — Ambulatory Visit (INDEPENDENT_AMBULATORY_CARE_PROVIDER_SITE_OTHER): Payer: Medicare Other

## 2023-10-15 VITALS — Ht 62.0 in | Wt 218.0 lb

## 2023-10-15 DIAGNOSIS — Z1212 Encounter for screening for malignant neoplasm of rectum: Secondary | ICD-10-CM | POA: Diagnosis not present

## 2023-10-15 DIAGNOSIS — Z1211 Encounter for screening for malignant neoplasm of colon: Secondary | ICD-10-CM | POA: Diagnosis not present

## 2023-10-15 DIAGNOSIS — Z0000 Encounter for general adult medical examination without abnormal findings: Secondary | ICD-10-CM

## 2023-10-15 NOTE — Patient Instructions (Signed)
 Blake Anderson , Thank you for taking time to come for your Medicare Wellness Visit. I appreciate your ongoing commitment to your health goals. Please review the following plan we discussed and let me know if I can assist you in the future.   Referrals/Orders/Follow-Ups/Clinician Recommendations: It was nice to talk with you today.  You are due for a colon cancer screening through [employer].  Be on the look out for kit in the mail.  Aim for 30 minutes of exercise or brisk walking, 6-8 glasses of water, and 5 servings of fruits and vegetables each day.   This is a list of the screening recommended for you and due dates:  Health Maintenance  Topic Date Due   Colog[facility]ard (Stool DNA test)  07/26/2022   Medicare Annual Wellness Visit  10/14/2024   DTaP/Tdap/Td vaccine (3 - Td or Tdap) 06/03/2032   Pneumonia Vaccine  Completed   Flu Shot  Completed   COVID-19 Vaccine  Completed   Hepatitis C Screening  Completed   Zoster (Shingles) Vaccine  Completed   HPV Vaccine  Aged Out    Advanced directives: (Declined) Advance directive discussed with you today. Even though you declined this today, please call our office should you change your mind, and we can give you the proper paperwork for you to fill out.  Next Medicare Annual Wellness Visit scheduled for next year: Yes

## 2023-10-15 NOTE — Progress Notes (Deleted)
 Subjective:   Blake Anderson is a 68 y.o. who presents for a Medicare Wellness preventive visit.  Visit Complete: Virtual I connected with  Blake Anderson on 10/15/23 by a audio enabled telemedicine application and verified that I am speaking with the correct person using two identifiers.  Patient Location: Home  Provider Location: Home Office  I discussed the limitations of evaluation and management by telemedicine. The patient expressed understanding and agreed to proceed.  Vital Signs: Because this visit was a virtual/telehealth visit, some criteria may be missing or patient reported. Any vitals not documented were not able to be obtained and vitals that have been documented are patient reported.  VideoDeclined- This patient declined Librarian, academic. Therefore the visit was completed with audio only.  AWV Questionnaire: No: Patient Medicare AWV questionnaire was not completed prior to this visit.        Objective:    Today's Vitals   10/15/23 1305  Weight: 218 lb (98.9 kg)  Height: 5\' 2"  (1.575 m)   Body mass index is 39.87 kg/m.     10/15/2023    1:09 PM 10/09/2022    3:30 PM  Advanced Directives  Does Patient Have a Medical Advance Directive? No No  Would patient like information on creating a medical advance directive?  No - Patient declined    Current Medications (verified) Outpatient Encounter Medications as of 10/15/2023  Medication Sig   aspirin 81 MG tablet Take 81 mg by mouth daily.   cholecalciferol (VITAMIN D3) 25 MCG (1000 UT) tablet Take 1,000 Units by mouth daily.   irbesartan (AVAPRO) 150 MG tablet Take 1 tablet (150 mg total) by mouth daily.   ketoconazole (NIZORAL) 2 % cream Apply 1 Application topically 2 (two) times daily.   rosuvastatin (CRESTOR) 40 MG tablet TAKE 1 TABLET BY MOUTH EVERY DAY   No facility-administered encounter medications on file as of 10/15/2023.    Allergies (verified) Lisinopril    History: Past Medical History:  Diagnosis Date   GERD (gastroesophageal reflux disease)    Hyperlipidemia    Hypertension    History reviewed. No pertinent surgical history. Family History  Problem Relation Age of Onset   Arthritis Mother    Alcohol abuse Father    Asthma Neg Hx    Cancer Neg Hx    COPD Neg Hx    Diabetes Neg Hx    Heart disease Neg Hx    Hyperlipidemia Neg Hx    Hypertension Neg Hx    Kidney disease Neg Hx    Stroke Neg Hx    Social History   Socioeconomic History   Marital status: Single    Spouse name: Not on file   Number of children: Not on file   Years of education: Not on file   Highest education level: Some college, no degree  Occupational History   Occupation: Retired  Tobacco Use   Smoking status: Never   Smokeless tobacco: Never  Vaping Use   Vaping status: Never Used  Substance and Sexual Activity   Alcohol use: No   Drug use: No   Sexual activity: Not Currently  Other Topics Concern   Not on file  Social History Narrative   Lives alone/2025   Social Drivers of Health   Financial Resource Strain: Low Risk  (10/15/2023)   Overall Financial Resource Strain (CARDIA)    Difficulty of Paying Living Expenses: Not hard at all  Food Insecurity: No Food Insecurity (10/15/2023)  Hunger Vital Sign    Worried About Running Out of Food in the Last Year: Never true    Ran Out of Food in the Last Year: Never true  Transportation Needs: No Transportation Needs (10/15/2023)   PRAPARE - Administrator, Civil Service (Medical): No    Lack of Transportation (Non-Medical): No  Physical Activity: Sufficiently Active (10/15/2023)   Exercise Vital Sign    Days of Exercise per Week: 7 days    Minutes of Exercise per Session: 30 min  Stress: No Stress Concern Present (10/15/2023)   [employer] of Occupational Health - Occupational Stress Questionnaire    Feeling of Stress : Not at all  Social Connections: Socially Isolated (10/15/2023)    Social Connection and Isolation Panel [NHANES]    Frequency of Communication with Friends and Family: Three times a week    Frequency of Social Gatherings with Friends and Family: Once a week    Attends Religious Services: Never    Database administrator or Organizations: No    Attends Engineer, structural: Never    Marital Status: Never married    Tobacco Counseling Counseling given: Not Answered    Clinical Intake:  Pre-visit preparation completed: Yes  Pain : No/denies pain     BMI - recorded: 39.87 Nutritional Status: BMI > 30  Obese Nutritional Risks: None Diabetes: No  How often do you need to have someone help you when you read instructions, pamphlets, or other written materials from your doctor or pharmacy?: 1 - Never     Information entered by :: Wendall Stade, RMA   Activities of Daily Living     10/09/2023   10:27 AM  In your present state of health, do you have any difficulty performing the following activities:  Hearing? 0  Vision? 0  Difficulty concentrating or making decisions? 0  Walking or climbing stairs? 0  Dressing or bathing? 0  Doing errands, shopping? 0  Preparing Food and eating ? N  Using the Toilet? N  In the past six months, have you accidently leaked urine? N  Do you have problems with loss of bowel control? Y  Managing your Medications? N  Managing your Finances? N  Housekeeping or managing your Housekeeping? Y    Patient Care Team: Etta Grandchild, MD as PCP - General (Internal Medicine)  Indicate any recent Medical Services you may have received from other than [facility] providers in the past year (date may be approximate).     Assessment:   This is a routine wellness examination for Blake Anderson.  Hearing/Vision screen Hearing Screening - Comments:: Denies hearing difficulties   Vision Screening - Comments:: Wears eyeglasses to read   Goals Addressed               This Visit's Progress     Stay Healthy  (pt-stated)   On track      Depression Screen     10/15/2023    1:11 PM 03/31/2023    9:14 AM 12/31/2022    2:17 PM 02/06/2022    9:44 AM 10/10/2020   10:00 AM 05/03/2019    3:15 PM 07/28/2018    3:59 PM  PHQ 2/9 Scores  PHQ - 2 Score 0 3 0 0 0 0 0  PHQ- 9 Score 0 5  4       Fall Risk     10/09/2023   10:27 AM 03/31/2023    9:14 AM 12/31/2022    2:16  PM 10/09/2022    3:29 PM 10/08/2022    7:00 PM  Fall Risk   Falls in the past year? 0 0 0 0 0  Number falls in past yr: 0 0 0 0 0  Injury with Fall? 0 0 0 0 0  Risk for fall due to : No Fall Risks No Fall Risks No Fall Risks No Fall Risks   Follow up Falls prevention discussed;Falls evaluation completed Falls evaluation completed Falls evaluation completed Falls prevention discussed     MEDICARE RISK AT HOME:  Medicare Risk at Home Any stairs in or around the home?: (Patient-Rptd) No Home free of loose throw rugs in walkways, pet beds, electrical cords, etc?: (Patient-Rptd) Yes Adequate lighting in your home to reduce risk of falls?: (Patient-Rptd) Yes Life alert?: (Patient-Rptd) No Use of a cane, walker or w/c?: (Patient-Rptd) No Grab bars in the bathroom?: (Patient-Rptd) No Shower chair or bench in shower?: (Patient-Rptd) No Elevated toilet seat or a handicapped toilet?: (Patient-Rptd) No  TIMED UP AND GO:  Was the test performed?  No  Cognitive Function: 6CIT completed        10/15/2023    1:09 PM 10/09/2022    3:30 PM  6CIT Screen  What Year? 0 points 0 points  What month? 0 points 0 points  What time? 0 points 0 points  Count back from 20 0 points 0 points  Months in reverse 0 points 0 points  Repeat phrase 0 points 0 points  Total Score 0 points 0 points    Immunizations Immunization History  Administered Date(s) Administered   Fluad Quad(high Dose 65+) 05/10/2021, 05/27/2022   Hepatitis A 01/25/2017, 07/28/2017   Influenza Inj Mdck Quad Pf 05/09/2017   Influenza,inj,Quad PF,6+ Mos 05/14/2013, 09/19/2014,  04/14/2018, 05/03/2019, 05/11/2020   PFIZER(Purple Top)SARS-COV-2 Vaccination 11/11/2019, 11/11/2019, 12/02/2019, 07/24/2020   PNEUMOCOCCAL CONJUGATE-20 05/10/2021   Pfizer Covid-19 Vaccine Bivalent Booster 63yrs & up 07/25/2021   Pfizer Covid-19 Vaccine Bivalent Booster 5y-11y 07/25/2021   Pfizer(Comirnaty)Fall Seasonal Vaccine 12 years and older 05/27/2022, 06/10/2023   Respiratory Syncytial Virus Vaccine,Recomb Aduvanted(Arexvy) 06/03/2022   Tdap 10/07/2011, 06/03/2022   Zoster Recombinant(Shingrix) 01/25/2017, 05/09/2017    Screening Tests Health Maintenance  Topic Date Due   Fecal DNA (Cologuard)  07/26/2022   Medicare Annual Wellness (AWV)  10/14/2024   DTaP/Tdap/Td (3 - Td or Tdap) 06/03/2032   Pneumonia Vaccine 61+ Years old  Completed   INFLUENZA VACCINE  Completed   COVID-19 Vaccine  Completed   Hepatitis C Screening  Completed   Zoster Vaccines- Shingrix  Completed   HPV VACCINES  Aged Out    Health Maintenance  Health Maintenance Due  Topic Date Due   Fecal DNA (Cologuard)  07/26/2022   Health Maintenance Items Addressed: Cologuard Ordered  Additional Screening:  Vision Screening: Recommended annual ophthalmology exams for early detection of glaucoma and other disorders of the eye.  Dental Screening: Recommended annual dental exams for proper oral hygiene  Community Resource Referral / Chronic Care Management: CRR required this visit?  No   CCM required this visit?  No     Plan:     I have personally reviewed and noted the following in the patient's chart:   Medical and social history Use of alcohol, tobacco or illicit drugs  Current medications and supplements including opioid prescriptions. Patient is not currently taking opioid prescriptions. Functional ability and status Nutritional status Physical activity Advanced directives List of other physicians Hospitalizations, surgeries, and ER visits in previous 12 months  Vitals Screenings to  include cognitive, depression, and falls Referrals and appointments  In addition, I have reviewed and discussed with patient certain preventive protocols, quality metrics, and best practice recommendations. A written personalized care plan for preventive services as well as general preventive health recommendations were provided to patient.     Thursa Emme L Jerimah Witucki, CMA   10/15/2023   After Visit Summary: (MyChart) Due to this being a telephonic visit, the after visit summary with patients personalized plan was offered to patient via MyChart   Notes: Please refer to Routing Comments.

## 2023-10-17 NOTE — Progress Notes (Addendum)
 Subjective:   Blake Anderson is a 68 y.o. who presents for a Medicare Wellness preventive visit.  Visit Complete: Virtual I connected with  [name] on 10/17/23 by a audio enabled telemedicine application and verified that I am speaking with the correct person using two identifiers.  Patient Location: Home  Provider Location: Home Office  I discussed the limitations of evaluation and management by telemedicine. The patient expressed understanding and agreed to proceed.  Vital Signs: Because this visit was a virtual/telehealth visit, some criteria may be missing or patient reported. Any vitals not documented were not able to be obtained and vitals that have been documented are patient reported.  VideoDeclined- This patient declined Librarian, academic. Therefore the visit was completed with audio only.  AWV Questionnaire: Yes: Patient Medicare AWV questionnaire was completed by the patient on 10/09/2023; I have confirmed that all information answered by patient is correct and no changes since this date.        Objective:    Today's Vitals   10/15/23 1305  Weight: 218 lb (98.9 kg)  Height: 5\' 2"  (1.575 m)   Body mass index is 39.87 kg/m.     10/15/2023    1:09 PM 10/09/2022    3:30 PM  Advanced Directives  Does Patient Have a Medical Advance Directive? No No  Would patient like information on creating a medical advance directive?  No - Patient declined    Current Medications (verified) Outpatient Encounter Medications as of 10/15/2023  Medication Sig   aspirin 81 MG tablet Take 81 mg by mouth daily.   cholecalciferol (VITAMIN D3) 25 MCG (1000 UT) tablet Take 1,000 Units by mouth daily.   irbesartan (AVAPRO) 150 MG tablet Take 1 tablet (150 mg total) by mouth daily.   ketoconazole (NIZORAL) 2 % cream Apply 1 Application topically 2 (two) times daily.   rosuvastatin (CRESTOR) 40 MG tablet TAKE 1 TABLET BY MOUTH EVERY DAY   No  facility-administered encounter medications on file as of 10/15/2023.    Allergies (verified) Lisinopril   History: Past Medical History:  Diagnosis Date   GERD (gastroesophageal reflux disease)    Hyperlipidemia    Hypertension    History reviewed. No pertinent surgical history. Family History  Problem Relation Age of Onset   Arthritis Mother    Alcohol abuse Father    Asthma Neg Hx    Cancer Neg Hx    COPD Neg Hx    Diabetes Neg Hx    Heart disease Neg Hx    Hyperlipidemia Neg Hx    Hypertension Neg Hx    Kidney disease Neg Hx    Stroke Neg Hx    Social History   Socioeconomic History   Marital status: Single    Spouse name: Not on file   Number of children: Not on file   Years of education: Not on file   Highest education level: Some college, no degree  Occupational History   Occupation: Retired  Tobacco Use   Smoking status: Never   Smokeless tobacco: Never  Vaping Use   Vaping status: Never Used  Substance and Sexual Activity   Alcohol use: No   Drug use: No   Sexual activity: Not Currently  Other Topics Concern   Not on file  Social History Narrative   Lives alone/2025   Social Drivers of Health   Financial Resource Strain: Low Risk  (10/15/2023)   Overall Financial Resource Strain (CARDIA)    Difficulty  of Paying Living Expenses: Not hard at all  Food Insecurity: No Food Insecurity (10/15/2023)   Hunger Vital Sign    Worried About Running Out of Food in the Last Year: Never true    Ran Out of Food in the Last Year: Never true  Transportation Needs: No Transportation Needs (10/15/2023)   PRAPARE - Administrator, Civil Service (Medical): No    Lack of Transportation (Non-Medical): No  Physical Activity: Sufficiently Active (10/15/2023)   Exercise Vital Sign    Days of Exercise per Week: 7 days    Minutes of Exercise per Session: 30 min  Stress: No Stress Concern Present (10/15/2023)   [employer] of Occupational Health - Occupational  Stress Questionnaire    Feeling of Stress : Not at all  Social Connections: Socially Isolated (10/15/2023)   Social Connection and Isolation Panel [NHANES]    Frequency of Communication with Friends and Family: Three times a week    Frequency of Social Gatherings with Friends and Family: Once a week    Attends Religious Services: Never    Database administrator or Organizations: No    Attends Engineer, structural: Never    Marital Status: Never married    Tobacco Counseling Counseling given: Not Answered    Clinical Intake:  Pre-visit preparation completed: Yes  Pain : No/denies pain     BMI - recorded: 39.87 Nutritional Status: BMI > 30  Obese Nutritional Risks: None Diabetes: No  How often do you need to have someone help you when you read instructions, pamphlets, or other written materials from your doctor or pharmacy?: 1 - Never     Information entered by :: Wendall Stade, RMA   Activities of Daily Living     10/09/2023   10:27 AM  In your present state of health, do you have any difficulty performing the following activities:  Hearing? 0  Vision? 0  Difficulty concentrating or making decisions? 0  Walking or climbing stairs? 0  Dressing or bathing? 0  Doing errands, shopping? 0  Preparing Food and eating ? N  Using the Toilet? N  In the past six months, have you accidently leaked urine? N  Do you have problems with loss of bowel control? Y  Managing your Medications? N  Managing your Finances? N  Housekeeping or managing your Housekeeping? Y    Patient Care Team: Etta Grandchild, MD as PCP - General (Internal Medicine)  Indicate any recent Medical Services you may have received from other than [facility] providers in the past year (date may be approximate).     Assessment:   This is a routine wellness examination for Blake Anderson.  Hearing/Vision screen Hearing Screening - Comments:: Denies hearing difficulties   Vision Screening - Comments:: Wears  eyeglasses to read   Goals Addressed               This Visit's Progress     Stay Healthy (pt-stated)   On track      Depression Screen     10/15/2023    1:11 PM 03/31/2023    9:14 AM 12/31/2022    2:17 PM 02/06/2022    9:44 AM 10/10/2020   10:00 AM 05/03/2019    3:15 PM 07/28/2018    3:59 PM  PHQ 2/9 Scores  PHQ - 2 Score 0 3 0 0 0 0 0  PHQ- 9 Score 0 5  4       Fall Risk  10/09/2023   10:27 AM 03/31/2023    9:14 AM 12/31/2022    2:16 PM 10/09/2022    3:29 PM 10/08/2022    7:00 PM  Fall Risk   Falls in the past year? 0 0 0 0 0  Number falls in past yr: 0 0 0 0 0  Injury with Fall? 0 0 0 0 0  Risk for fall due to : No Fall Risks No Fall Risks No Fall Risks No Fall Risks   Follow up Falls prevention discussed;Falls evaluation completed Falls evaluation completed Falls evaluation completed Falls prevention discussed     MEDICARE RISK AT HOME:  Medicare Risk at Home Any stairs in or around the home?: (Patient-Rptd) No Home free of loose throw rugs in walkways, pet beds, electrical cords, etc?: (Patient-Rptd) Yes Adequate lighting in your home to reduce risk of falls?: (Patient-Rptd) Yes Life alert?: (Patient-Rptd) No Use of a cane, walker or w/c?: (Patient-Rptd) No Grab bars in the bathroom?: (Patient-Rptd) No Shower chair or bench in shower?: (Patient-Rptd) No Elevated toilet seat or a handicapped toilet?: (Patient-Rptd) No  TIMED UP AND GO:  Was the test performed?  No  Cognitive Function: 6CIT completed        10/15/2023    1:09 PM 10/09/2022    3:30 PM  6CIT Screen  What Year? 0 points 0 points  What month? 0 points 0 points  What time? 0 points 0 points  Count back from 20 0 points 0 points  Months in reverse 0 points 0 points  Repeat phrase 0 points 0 points  Total Score 0 points 0 points    Immunizations Immunization History  Administered Date(s) Administered   Fluad Quad(high Dose 65+) 05/10/2021, 05/27/2022   Hepatitis A 01/25/2017, 07/28/2017    Influenza Inj Mdck Quad Pf 05/09/2017   Influenza,inj,Quad PF,6+ Mos 05/14/2013, 09/19/2014, 04/14/2018, 05/03/2019, 05/11/2020   PFIZER(Purple Top)SARS-COV-2 Vaccination 11/11/2019, 11/11/2019, 12/02/2019, 07/24/2020   PNEUMOCOCCAL CONJUGATE-20 05/10/2021   Pfizer Covid-19 Vaccine Bivalent Booster 69yrs & up 07/25/2021   Pfizer Covid-19 Vaccine Bivalent Booster 5y-11y 07/25/2021   Pfizer(Comirnaty)Fall Seasonal Vaccine 12 years and older 05/27/2022, 06/10/2023   Respiratory Syncytial Virus Vaccine,Recomb Aduvanted(Arexvy) 06/03/2022   Tdap 10/07/2011, 06/03/2022   Zoster Recombinant(Shingrix) 01/25/2017, 05/09/2017    Screening Tests Health Maintenance  Topic Date Due   Fecal DNA (Cologuard)  07/26/2022   Medicare Annual Wellness (AWV)  10/14/2024   DTaP/Tdap/Td (3 - Td or Tdap) 06/03/2032   Pneumonia Vaccine 42+ Years old  Completed   INFLUENZA VACCINE  Completed   COVID-19 Vaccine  Completed   Hepatitis C Screening  Completed   Zoster Vaccines- Shingrix  Completed   HPV VACCINES  Aged Out    Health Maintenance  Health Maintenance Due  Topic Date Due   Fecal DNA (Cologuard)  07/26/2022   Health Maintenance Items Addressed: Cologuard Ordered  Additional Screening:  Vision Screening: Recommended annual ophthalmology exams for early detection of glaucoma and other disorders of the eye.  Dental Screening: Recommended annual dental exams for proper oral hygiene  Community Resource Referral / Chronic Care Management: CRR required this visit?  No   CCM required this visit?  No     Plan:     I have personally reviewed and noted the following in the patient's chart:   Medical and social history Use of alcohol, tobacco or illicit drugs  Current medications and supplements including opioid prescriptions. Patient is not currently taking opioid prescriptions. Functional ability and status Nutritional status Physical  activity Advanced directives List of other  physicians Hospitalizations, surgeries, and ER visits in previous 12 months Vitals Screenings to include cognitive, depression, and falls Referrals and appointments  In addition, I have reviewed and discussed with patient certain preventive protocols, quality metrics, and best practice recommendations. A written personalized care plan for preventive services as well as general preventive health recommendations were provided to patient.     Raychelle Hudman L Jaaziah Schulke, CMA   10/15/2023   After Visit Summary: (MyChart) Due to this being a telephonic visit, the after visit summary with patients personalized plan was offered to patient via MyChart   Notes: Please refer to Routing Comments.

## 2023-10-27 DIAGNOSIS — Z1211 Encounter for screening for malignant neoplasm of colon: Secondary | ICD-10-CM | POA: Diagnosis not present

## 2023-10-27 DIAGNOSIS — Z1212 Encounter for screening for malignant neoplasm of rectum: Secondary | ICD-10-CM | POA: Diagnosis not present

## 2023-10-29 ENCOUNTER — Encounter: Payer: Self-pay | Admitting: Internal Medicine

## 2023-10-29 ENCOUNTER — Ambulatory Visit (INDEPENDENT_AMBULATORY_CARE_PROVIDER_SITE_OTHER): Payer: Medicare Other | Admitting: Internal Medicine

## 2023-10-29 VITALS — BP 134/82 | HR 95 | Temp 97.3°F | Resp 16 | Ht 62.0 in | Wt 218.8 lb

## 2023-10-29 DIAGNOSIS — I10 Essential (primary) hypertension: Secondary | ICD-10-CM | POA: Diagnosis not present

## 2023-10-29 DIAGNOSIS — E785 Hyperlipidemia, unspecified: Secondary | ICD-10-CM

## 2023-10-29 LAB — CBC WITH DIFFERENTIAL/PLATELET
Basophils Absolute: 0.1 10*3/uL (ref 0.0–0.1)
Basophils Relative: 0.9 % (ref 0.0–3.0)
Eosinophils Absolute: 0.3 10*3/uL (ref 0.0–0.7)
Eosinophils Relative: 2.6 % (ref 0.0–5.0)
HCT: 50.4 % (ref 39.0–52.0)
Hemoglobin: 17.1 g/dL — ABNORMAL HIGH (ref 13.0–17.0)
Lymphocytes Relative: 14.4 % (ref 12.0–46.0)
Lymphs Abs: 1.5 10*3/uL (ref 0.7–4.0)
MCHC: 33.9 g/dL (ref 30.0–36.0)
MCV: 90.7 fl (ref 78.0–100.0)
Monocytes Absolute: 0.8 10*3/uL (ref 0.1–1.0)
Monocytes Relative: 8 % (ref 3.0–12.0)
Neutro Abs: 7.5 10*3/uL (ref 1.4–7.7)
Neutrophils Relative %: 74.1 % (ref 43.0–77.0)
Platelets: 248 10*3/uL (ref 150.0–400.0)
RBC: 5.55 Mil/uL (ref 4.22–5.81)
RDW: 13.7 % (ref 11.5–15.5)
WBC: 10.1 10*3/uL (ref 4.0–10.5)

## 2023-10-29 LAB — BASIC METABOLIC PANEL
BUN: 13 mg/dL (ref 6–23)
CO2: 23 meq/L (ref 19–32)
Calcium: 9.6 mg/dL (ref 8.4–10.5)
Chloride: 100 meq/L (ref 96–112)
Creatinine, Ser: 0.89 mg/dL (ref 0.40–1.50)
GFR: 88.2 mL/min (ref >60.00)
Glucose, Bld: 78 mg/dL (ref 70–99)
Potassium: 4.6 meq/L (ref 3.5–5.1)
Sodium: 134 meq/L — ABNORMAL LOW (ref 135–145)

## 2023-10-29 LAB — TSH: TSH: 4.25 u[IU]/mL (ref 0.35–5.50)

## 2023-10-29 LAB — LIPID PANEL
Cholesterol: 113 mg/dL (ref 0–200)
HDL: 37.7 mg/dL — ABNORMAL LOW (ref >39.00)
LDL Cholesterol: 48 mg/dL (ref 0–99)
NonHDL: 75.06
Total CHOL/HDL Ratio: 3
Triglycerides: 134 mg/dL (ref 0.0–149.0)
VLDL: 26.8 mg/dL (ref 0.0–40.0)

## 2023-10-29 NOTE — Progress Notes (Signed)
 Subjective:  Patient ID: Blake Anderson, male    DOB: 1956-03-29  Age: 68 y.o. MRN: [id number]  CC: Hypertension and Hyperlipidemia   HPI Blake Anderson presents for f/up ----  Discussed the use of AI scribe software for clinical note transcription with the patient, who gave verbal consent to proceed.  History of Present Illness   Blake Anderson is a 68 year old male who presents for a routine follow-up visit.  He feels good and stays active, with plans to increase his activity level over the summer. No chest pain, shortness of breath, dizziness, or lightheadedness during physical activity. His condition has improved significantly over the past couple of years, as he previously found it challenging to walk from a baseball game to the parking lot.  He has seen a cardiologist twice, with the last visit in September of the previous year. He is scheduled for another visit this coming September. His last EKG was conducted in May of the previous year.  He recently submitted a Cologuard kit for colon cancer screening and is awaiting results. He experienced a brief episode of constipation several weeks ago, which resolved on its own.       Outpatient Medications Prior to Visit  Medication Sig Dispense Refill   aspirin 81 MG tablet Take 81 mg by mouth daily.     Cholecalciferol (VITAMIN D-3 PO) Take 50 mg by mouth daily.     ketoconazole (NIZORAL) 2 % cream Apply 1 Application topically 2 (two) times daily. 60 g 3   rosuvastatin (CRESTOR) 40 MG tablet TAKE 1 TABLET BY MOUTH EVERY DAY 90 tablet 3   irbesartan (AVAPRO) 150 MG tablet Take 1 tablet (150 mg total) by mouth daily. 90 tablet 1   cholecalciferol (VITAMIN D3) 25 MCG (1000 UT) tablet Take 1,000 Units by mouth daily.     No facility-administered medications prior to visit.    ROS Review of Systems  Objective:  BP 134/82 (BP Location: Left Arm, Patient Position: Sitting, Cuff Size: Large)   Pulse 95   Temp (!) 97.3  F (36.3 C) (Temporal)   Resp 16   Ht 5\' 2"  (1.575 m)   Wt 218 lb 12.8 oz (99.2 kg)   SpO2 95%   BMI 40.02 kg/m   BP Readings from Last 3 Encounters:  10/29/23 134/82  03/31/23 138/86  03/26/23 130/88    Wt Readings from Last 3 Encounters:  10/29/23 218 lb 12.8 oz (99.2 kg)  10/15/23 218 lb (98.9 kg)  03/31/23 218 lb (98.9 kg)    Physical Exam Vitals reviewed.  HENT:     Nose: Nose normal.     Mouth/Throat:     Mouth: Mucous membranes are moist.  Eyes:     General: No scleral icterus.    Conjunctiva/sclera: Conjunctivae normal.  Cardiovascular:     Rate and Rhythm: Normal rate and regular rhythm.     Heart sounds: Normal heart sounds, S1 normal and S2 normal. No murmur heard.    Comments: EKG- NSR, 92 bpm No LVH, Q waves, or ST/T wave changes  Pulmonary:     Effort: Pulmonary effort is normal.     Breath sounds: No stridor. No wheezing, rhonchi or rales.  Abdominal:     General: Abdomen is protuberant. Bowel sounds are normal. There is no distension.     Palpations: Abdomen is soft. There is no hepatomegaly, splenomegaly or mass.     Tenderness: There is no abdominal tenderness. There is no  guarding.     Hernia: A hernia is present. Hernia is present in the ventral area. There is no hernia in the umbilical area.  Musculoskeletal:     Cervical back: Neck supple.     Right lower leg: No edema.     Left lower leg: No edema.  Lymphadenopathy:     Cervical: No cervical adenopathy.  Skin:    General: Skin is warm and dry.  Neurological:     General: No focal deficit present.     Mental Status: He is alert. Mental status is at baseline.     Lab Results  Component Value Date   WBC 10.1 10/29/2023   HGB 17.1 (H) 10/29/2023   HCT 50.4 10/29/2023   PLT 248.0 10/29/2023   GLUCOSE 78 10/29/2023   CHOL 113 10/29/2023   TRIG 134.0 10/29/2023   HDL 37.70 (L) 10/29/2023   LDLDIRECT 70.0 02/11/2017   LDLCALC 48 10/29/2023   ALT 23 03/31/2023   AST 20 03/31/2023    NA 134 (L) 10/29/2023   K 4.6 10/29/2023   CL 100 10/29/2023   CREATININE 0.89 10/29/2023   BUN 13 10/29/2023   CO2 23 10/29/2023   TSH 4.25 10/29/2023   PSA 0.78 03/31/2023   HGBA1C 5.7 02/11/2017    CT CORONARY MORPH W/CTA COR W/SCORE W/CA W/CM &/OR WO/CM Addendum Date: 04/02/2022 ADDENDUM REPORT: 04/02/2022 16:11 EXAM: OVER-READ INTERPRETATION  CT CHEST The following report is a limited chest CT over-read performed by radiologist Dr. Irma Newness [facility] on 04/02/2022. This over-read does not include interpretation of cardiac or coronary anatomy or pathology. The coronary CTA interpretation by the cardiologist is attached. COMPARISON:  Cardiac CT 02/22/2022 FINDINGS: Mediastinum/Nodes: No enlarged lymph nodes within the visualized mediastinum.No significant extracardiac vascular findings. Lungs/Pleura: There is no pleural effusion. The visualized lungs remain clear. Upper abdomen: No significant findings in the visualized upper abdomen. Musculoskeletal/Chest wall: No chest wall mass or suspicious osseous findings within the visualized chest. IMPRESSION: No significant extracardiac findings within the visualized chest. Electronically Signed   By: Carey Bullocks M.D.   On: 04/02/2022 16:11   Result Date: 04/02/2022 CLINICAL DATA:  Chest pain EXAM: Cardiac/Coronary CTA TECHNIQUE: A non-contrast, gated CT scan was obtained with axial slices of 3 mm through the heart for calcium scoring. Calcium scoring was performed using the Agatston method. A 120 kV prospective, gated, contrast cardiac scan was obtained. Gantry rotation speed was 250 msecs and collimation was 0.6 mm. Two sublingual nitroglycerin tablets (0.8 mg) were given. The 3D data set was reconstructed in 5% intervals of the 35-75% of the R-R cycle. Diastolic phases were analyzed on a dedicated workstation using MPR, MIP, and VRT modes. The patient received 95 cc of contrast. FINDINGS: Image quality: Excellent. Noise artifact  is: Limited. Coronary Arteries:  Normal coronary origin.  Right dominance. Left main: The left main is a large caliber vessel with a normal take off from the left coronary cusp that trifurcates into a LAD, LCX, and ramus intermedius. There is minimal calcified plaque (<25%). Left anterior descending artery: The proximal LAD contains moderate mixed density plaque (50-69%). The mid and distal segments are patent. A mid LAD myocardial bridge is present (normal variant). The first diagonal contains mild mixed density plaque (25-49%). The second diagonal is patent. Ramus intermedius: Too small for analysis. Left circumflex artery: The LCX is non-dominant. The proximal LCX contains mild calcified plaque (25-49%). The LCX gives off 1 patent OM branch. Right coronary artery: The RCA  is dominant with normal take off from the right coronary cusp. There is minimal diffuse mixed density plaque (<25%). The RCA terminates as a PDA and right posterolateral branch without evidence of plaque or stenosis. Right Atrium: Right atrial size is within normal limits. Right Ventricle: The right ventricular cavity is within normal limits. Left Atrium: Left atrial size is normal in size with no left atrial appendage filling defect. Small PFO. Left Ventricle: The ventricular cavity size is within normal limits. Pulmonary arteries: Normal in size without proximal filling defect. Pulmonary veins: Normal pulmonary venous drainage. Pericardium: Normal thickness without significant effusion or calcium present. Prominent pericardial fat. Cardiac valves: The aortic valve is trileaflet without significant calcification. The mitral valve is normal without significant calcification. Aorta: Normal caliber without significant disease. Extra-cardiac findings: See attached radiology report for non-cardiac structures. IMPRESSION: 1. Coronary calcium score of 1286. This was 93rd percentile for age-, sex, and race-matched controls. 2. Normal coronary origin  with right dominance. 3. Moderate mixed density plaque (50-69%) in the proximal LAD. 4. Mild calcified plaque (25-49%) in the LCX. 5. Minimal mixed density plaque (<25%) in the RCA. 6. Mid LAD myocardial bridge (normal). 7. Small PFO. RECOMMENDATIONS: 1. Moderate stenosis. Consider symptom-guided anti-ischemic pharmacotherapy as well as risk factor modification per guideline directed care. Additional analysis with CT FFR will be submitted. Lennie Odor, MD Electronically Signed: By: Lennie Odor M.D. On: 04/02/2022 14:38   CT CORONARY FRACTIONAL FLOW RESERVE DATA PREP Result Date: 04/02/2022 EXAM: CT FFR analysis was performed on the original cardiac CTA dataset. Diagrammatic representation of the CT FFR analysis is provided in a separate PDF document in PACS. This dictation was created using the PDF document and an interactive 3D model of the results. The 3D model is not available in the EMR/PACS. INTERPRETATION: CT FFR provides simultaneous calculation of pressure and flow across the entire coronary tree. For clinical decision making, CT FFR values should be obtained 1-2 cm distal to the lower border of each stenosis measured. Coronary CTA-related artifacts may impair the diagnostic accuracy of the original cardiac CTA and FFR CT results. *Due to the fact that CT FFR represents a mathematically-derived analysis, it is recommended that the results be interpreted as follows: 1. CT FFR >0.80: Low likelihood of hemodynamic significance. 2. CT FFR 0.76-0.80: Borderline likelihood of hemodynamic significance. 3. CT FFR =< 0.75: High likelihood of hemodynamic significance. *Coronary CT Angiography-derived Fractional Flow Reserve Testing in Patients with Stable Coronary Artery Disease: Recommendations on Interpretation and Reporting. Radiology: Cardiothoracic Imaging. 2019;1(5):e190050 FINDINGS: 1. Left Main: 0.99; low likelihood of hemodynamic significance. 2. Prox LAD: 0.91; low likelihood of hemodynamic  significance. 3. D1: 0.86; low likelihood of hemodynamic significance. 4. LCX: 0.94; low likelihood of hemodynamic significance. 5. RCA: 0.96; low likelihood of hemodynamic significance. IMPRESSION: 1.  CT FFR analysis didn't show any significant stenosis. Lennie Odor, MD Electronically Signed   By: Lennie Odor M.D.   On: 04/02/2022 14:45   CT CORONARY FRACTIONAL FLOW RESERVE FLUID ANALYSIS Result Date: 04/02/2022 EXAM: CT FFR analysis was performed on the original cardiac CTA dataset. Diagrammatic representation of the CT FFR analysis is provided in a separate PDF document in PACS. This dictation was created using the PDF document and an interactive 3D model of the results. The 3D model is not available in the EMR/PACS. INTERPRETATION: CT FFR provides simultaneous calculation of pressure and flow across the entire coronary tree. For clinical decision making, CT FFR values should be obtained 1-2 cm distal to the lower  border of each stenosis measured. Coronary CTA-related artifacts may impair the diagnostic accuracy of the original cardiac CTA and FFR CT results. *Due to the fact that CT FFR represents a mathematically-derived analysis, it is recommended that the results be interpreted as follows: 1. CT FFR >0.80: Low likelihood of hemodynamic significance. 2. CT FFR 0.76-0.80: Borderline likelihood of hemodynamic significance. 3. CT FFR =< 0.75: High likelihood of hemodynamic significance. *Coronary CT Angiography-derived Fractional Flow Reserve Testing in Patients with Stable Coronary Artery Disease: Recommendations on Interpretation and Reporting. Radiology: Cardiothoracic Imaging. 2019;1(5):e190050 FINDINGS: 1. Left Main: 0.99; low likelihood of hemodynamic significance. 2. Prox LAD: 0.91; low likelihood of hemodynamic significance. 3. D1: 0.86; low likelihood of hemodynamic significance. 4. LCX: 0.94; low likelihood of hemodynamic significance. 5. RCA: 0.96; low likelihood of hemodynamic significance.  IMPRESSION: 1.  CT FFR analysis didn't show any significant stenosis. Lennie Odor, MD Electronically Signed   By: Lennie Odor M.D.   On: 04/02/2022 14:45    Assessment & Plan:   Essential hypertension, benign- BP is well controlled. EKG is negative for LVH. -     Basic metabolic panel; Future -     CBC with Differential/Platelet; Future -     EKG 12-Lead -     TSH; Future -     Irbesartan; Take 1 tablet (150 mg total) by mouth daily.  Dispense: 90 tablet; Refill: 1  Hyperlipidemia with target LDL less than 130 - LDL goal achieved. Doing well on the statin  -     Lipid panel; Future -     TSH; Future     Follow-up: Return in about 6 months (around 04/30/2024).  Sanda Linger, MD

## 2023-10-29 NOTE — Patient Instructions (Signed)
 Hypertension, Adult High blood pressure (hypertension) is when the force of blood pumping through the arteries is too strong. The arteries are the blood vessels that carry blood from the heart throughout the body. Hypertension forces the heart to work harder to pump blood and may cause arteries to become narrow or stiff. Untreated or uncontrolled hypertension can lead to a heart attack, heart failure, a stroke, kidney disease, and other problems. A blood pressure reading consists of a higher number over a lower number. Ideally, your blood pressure should be below 120/80. The first ("top") number is called the systolic pressure. It is a measure of the pressure in your arteries as your heart beats. The second ("bottom") number is called the diastolic pressure. It is a measure of the pressure in your arteries as the heart relaxes. What are the causes? The exact cause of this condition is not known. There are some conditions that result in high blood pressure. What increases the risk? Certain factors may make you more likely to develop high blood pressure. Some of these risk factors are under your control, including: Smoking. Not getting enough exercise or physical activity. Being overweight. Having too much fat, sugar, calories, or salt (sodium) in your diet. Drinking too much alcohol. Other risk factors include: Having a personal history of heart disease, diabetes, high cholesterol, or kidney disease. Stress. Having a family history of high blood pressure and high cholesterol. Having obstructive sleep apnea. Age. The risk increases with age. What are the signs or symptoms? High blood pressure may not cause symptoms. Very high blood pressure (hypertensive crisis) may cause: Headache. Fast or irregular heartbeats (palpitations). Shortness of breath. Nosebleed. Nausea and vomiting. Vision changes. Severe chest pain, dizziness, and seizures. How is this diagnosed? This condition is diagnosed by  measuring your blood pressure while you are seated, with your arm resting on a flat surface, your legs uncrossed, and your feet flat on the floor. The cuff of the blood pressure monitor will be placed directly against the skin of your upper arm at the level of your heart. Blood pressure should be measured at least twice using the same arm. Certain conditions can cause a difference in blood pressure between your right and left arms. If you have a high blood pressure reading during one visit or you have normal blood pressure with other risk factors, you may be asked to: Return on a different day to have your blood pressure checked again. Monitor your blood pressure at home for 1 week or longer. If you are diagnosed with hypertension, you may have other blood or imaging tests to help your health care provider understand your overall risk for other conditions. How is this treated? This condition is treated by making healthy lifestyle changes, such as eating healthy foods, exercising more, and reducing your alcohol intake. You may be referred for counseling on a healthy diet and physical activity. Your health care provider may prescribe medicine if lifestyle changes are not enough to get your blood pressure under control and if: Your systolic blood pressure is above 130. Your diastolic blood pressure is above 80. Your personal target blood pressure may vary depending on your medical conditions, your age, and other factors. Follow these instructions at home: Eating and drinking  Eat a diet that is high in fiber and potassium, and low in sodium, added sugar, and fat. An example of this eating plan is called the DASH diet. DASH stands for Dietary Approaches to Stop Hypertension. To eat this way: Eat  plenty of fresh fruits and vegetables. Try to fill one half of your plate at each meal with fruits and vegetables. Eat whole grains, such as whole-wheat pasta, brown rice, or whole-grain bread. Fill about one  fourth of your plate with whole grains. Eat or drink low-fat dairy products, such as skim milk or low-fat yogurt. Avoid fatty cuts of meat, processed or cured meats, and poultry with skin. Fill about one fourth of your plate with lean proteins, such as fish, chicken without skin, beans, eggs, or tofu. Avoid pre-made and processed foods. These tend to be higher in sodium, added sugar, and fat. Reduce your daily sodium intake. Many people with hypertension should eat less than 1,500 mg of sodium a day. Do not drink alcohol if: Your health care provider tells you not to drink. You are pregnant, may be pregnant, or are planning to become pregnant. If you drink alcohol: Limit how much you have to: 0-1 drink a day for women. 0-2 drinks a day for men. Know how much alcohol is in your drink. In the [place] one drink equals one 12 oz bottle of beer (355 mL), one 5 oz glass of wine (148 mL), or one 1 oz glass of hard liquor (44 mL). Lifestyle  Work with your health care provider to maintain a healthy body weight or to lose weight. Ask what an ideal weight is for you. Get at least 30 minutes of exercise that causes your heart to beat faster (aerobic exercise) most days of the week. Activities may include walking, swimming, or biking. Include exercise to strengthen your muscles (resistance exercise), such as Pilates or lifting weights, as part of your weekly exercise routine. Try to do these types of exercises for 30 minutes at least 3 days a week. Do not use any products that contain nicotine or tobacco. These products include cigarettes, chewing tobacco, and vaping devices, such as e-cigarettes. If you need help quitting, ask your health care provider. Monitor your blood pressure at home as told by your health care provider. Keep all follow-up visits. This is important. Medicines Take over-the-counter and prescription medicines only as told by your health care provider. Follow directions carefully. Blood  pressure medicines must be taken as prescribed. Do not skip doses of blood pressure medicine. Doing this puts you at risk for problems and can make the medicine less effective. Ask your health care provider about side effects or reactions to medicines that you should watch for. Contact a health care provider if you: Think you are having a reaction to a medicine you are taking. Have headaches that keep coming back (recurring). Feel dizzy. Have swelling in your ankles. Have trouble with your vision. Get help right away if you: Develop a severe headache or confusion. Have unusual weakness or numbness. Feel faint. Have severe pain in your chest or abdomen. Vomit repeatedly. Have trouble breathing. These symptoms may be an emergency. Get help right away. Call 911. Do not wait to see if the symptoms will go away. Do not drive yourself to the hospital. Summary Hypertension is when the force of blood pumping through your arteries is too strong. If this condition is not controlled, it may put you at risk for serious complications. Your personal target blood pressure may vary depending on your medical conditions, your age, and other factors. For most people, a normal blood pressure is less than 120/80. Hypertension is treated with lifestyle changes, medicines, or a combination of both. Lifestyle changes include losing weight, eating a healthy,  low-sodium diet, exercising more, and limiting alcohol. This information is not intended to replace advice given to you by your health care provider. Make sure you discuss any questions you have with your health care provider. Document Revised: 06/05/2021 Document Reviewed: 06/05/2021 Elsevier Patient Education  2024 [employer].

## 2023-10-31 ENCOUNTER — Encounter: Payer: Self-pay | Admitting: Internal Medicine

## 2023-10-31 LAB — COLOGUARD: COLOGUARD: NEGATIVE

## 2023-11-01 ENCOUNTER — Other Ambulatory Visit: Payer: Self-pay | Admitting: Internal Medicine

## 2023-11-01 DIAGNOSIS — I10 Essential (primary) hypertension: Secondary | ICD-10-CM

## 2023-11-01 MED ORDER — IRBESARTAN 150 MG PO TABS: 150.0000 mg | ORAL_TABLET | Freq: Every day | ORAL | 1 refills | Status: DC

## 2024-02-24 ENCOUNTER — Ambulatory Visit (HOSPITAL_COMMUNITY)
Admission: EM | Admit: 2024-02-24 | Discharge: 2024-02-24 | Disposition: A | Discharge status: Home / Self Care | Attending: Physician Assistant | Admitting: Physician Assistant

## 2024-02-24 ENCOUNTER — Encounter (HOSPITAL_COMMUNITY): Payer: Self-pay | Admitting: Emergency Medicine

## 2024-02-24 DIAGNOSIS — R42 Dizziness and giddiness: Secondary | ICD-10-CM

## 2024-02-24 MED ORDER — MECLIZINE HCL 25 MG PO TABS: 25.0000 mg | ORAL_TABLET | Freq: Three times a day (TID) | ORAL | 0 refills | Status: AC | PRN

## 2024-02-24 NOTE — ED Triage Notes (Signed)
 Pt reports for over a week had intermittent dizziness. Reports when gets up in mornings, changing positions or bending over is when happens.

## 2024-02-24 NOTE — ED Provider Notes (Signed)
 [facility]    CSN: [id number] Arrival date & time: 02/24/24  1511      History   Chief Complaint Chief Complaint  Patient presents with   Dizziness    HPI Blake Anderson is a 68 y.o. male.   Patient presents today for evaluation of dizziness has had intermittently over the week.  He reports that seems to occur when he is sitting from a lying position or if he is bending over at times or leaning his head back.  He denies any chest pain or shortness of breath.  He does have some headaches at baseline but these have not changed with current symptoms.  He describes dizziness as room spinning sensation and denies any lightheadedness or presyncopal signs.  He does report some nausea at times.  The history is provided by the patient.  Dizziness Associated symptoms: no chest pain, no headaches, no nausea, no shortness of breath and no vomiting     Past Medical History:  Diagnosis Date   GERD (gastroesophageal reflux disease)    Hyperlipidemia    Hypertension     Patient Active Problem List   Diagnosis Date Noted   Agatston CAC score, >400 02/23/2022   Benign prostatic hyperplasia without lower urinary tract symptoms 02/06/2022   Abnormal electrocardiogram 02/06/2022   Migraine headache without aura 02/11/2017   Dyshidrotic eczema 09/19/2014   Hyperlipidemia with target LDL less than 130 11/19/2013   Routine general medical examination at a health care facility 10/07/2011   Obesity (BMI 30-39.9) 10/07/2011   Essential hypertension, benign 10/07/2011    History reviewed. No pertinent surgical history.     Home Medications    Prior to Admission medications   Medication Sig Start Date End Date Taking? Authorizing Provider  meclizine  (ANTIVERT ) 25 MG tablet Take 1 tablet (25 mg total) by mouth 3 (three) times daily as needed for dizziness. 02/24/24  Yes Billy Asberry FALCON, PA-C  aspirin 81 MG tablet Take 81 mg by mouth daily.    [provider]   Cholecalciferol (VITAMIN D-3 PO) Take 50 mg by mouth daily.    [provider]  irbesartan  (AVAPRO ) 150 MG tablet Take 1 tablet (150 mg total) by mouth daily. 11/01/23   Joshua Debby CROME, MD  ketoconazole  (NIZORAL ) 2 % cream Apply 1 Application topically 2 (two) times daily. 12/30/22   Joshua Debby CROME, MD  rosuvastatin  (CRESTOR ) 40 MG tablet TAKE 1 TABLET BY MOUTH EVERY DAY 06/09/23   O'Neal, Darryle Debby, MD    Family History Family History  Problem Relation Age of Onset   Arthritis Mother    Alcohol abuse Father    Asthma Neg Hx    Cancer Neg Hx    COPD Neg Hx    Diabetes Neg Hx    Heart disease Neg Hx    Hyperlipidemia Neg Hx    Hypertension Neg Hx    Kidney disease Neg Hx    Stroke Neg Hx     Social History Social History   Tobacco Use   Smoking status: Never   Smokeless tobacco: Never  Vaping Use   Vaping status: Never Used  Substance Use Topics   Alcohol use: No   Drug use: No     Allergies   Lisinopril    Review of Systems Review of Systems  Constitutional:  Negative for chills and fever.  Eyes:  Negative for discharge and redness.  Respiratory:  Negative for shortness of breath.   Cardiovascular:  Negative for  chest pain.  Gastrointestinal:  Negative for nausea and vomiting.  Neurological:  Positive for dizziness. Negative for light-headedness, numbness and headaches.     Physical Exam Triage Vital Signs ED Triage Vitals  Encounter Vitals Group     BP 02/24/24 1522 138/89     Girls Systolic BP Percentile --      Girls Diastolic BP Percentile --      Boys Systolic BP Percentile --      Boys Diastolic BP Percentile --      Pulse Rate 02/24/24 1522 86     Resp 02/24/24 1522 18     Temp 02/24/24 1522 98.1 F (36.7 C)     Temp Source 02/24/24 1522 Oral     SpO2 02/24/24 1522 95 %     Weight --      Height --      Head Circumference --      Peak Flow --      Pain Score 02/24/24 1521 0     Pain Loc --      Pain Education --       Exclude from Growth Chart --    No data found.  Updated Vital Signs BP 138/89 (BP Location: Left Arm)   Pulse 86   Temp 98.1 F (36.7 C) (Oral)   Resp 18   SpO2 95%   Visual Acuity Right Eye Distance:   Left Eye Distance:   Bilateral Distance:    Right Eye Near:   Left Eye Near:    Bilateral Near:     Physical Exam Vitals and nursing note reviewed.  Constitutional:      General: He is not in acute distress.    Appearance: Normal appearance. He is not ill-appearing.  HENT:     Head: Normocephalic and atraumatic.     Right Ear: Tympanic membrane normal.     Left Ear: Tympanic membrane normal.     Nose: Nose normal. No congestion.  Eyes:     Extraocular Movements: Extraocular movements intact.     Conjunctiva/sclera: Conjunctivae normal.     Pupils: Pupils are equal, round, and reactive to light.  Cardiovascular:     Rate and Rhythm: Normal rate and regular rhythm.  Pulmonary:     Effort: Pulmonary effort is normal. No respiratory distress.     Breath sounds: No wheezing, rhonchi or rales.  Neurological:     Mental Status: He is alert.     Comments: Normal speech, no facial droop, normal finger-nose  Psychiatric:        Mood and Affect: Mood normal.        Behavior: Behavior normal.        Thought Content: Thought content normal.      UC Treatments / Results  Labs (all labs ordered are listed, but only abnormal results are displayed) Labs Reviewed - No data to display  EKG   Radiology No results found.  Procedures Procedures (including critical care time)  Medications Ordered in UC Medications - No data to display  Initial Impression / Assessment and Plan / UC Course  I have reviewed the triage vital signs and the nursing notes.  Pertinent labs & imaging results that were available during my care of the patient were reviewed by me and considered in my medical decision making (see chart for details).    Suspect likely vertigo as cause of symptoms  given presentation.  Recommended meclizine  for treatment and advised follow-up if no gradual improvement or with  any worsening symptoms.  Patient expressed understanding.  Final Clinical Impressions(s) / UC Diagnoses   Final diagnoses:  Vertigo   Discharge Instructions   None    ED Prescriptions     Medication Sig Dispense Auth. Provider   meclizine  (ANTIVERT ) 25 MG tablet Take 1 tablet (25 mg total) by mouth 3 (three) times daily as needed for dizziness. 30 tablet Billy Asberry FALCON, PA-C      PDMP not reviewed this encounter.   Billy Asberry FALCON, PA-C 02/24/24 [phone]

## 2024-03-18 DIAGNOSIS — H524 Presbyopia: Secondary | ICD-10-CM | POA: Diagnosis not present

## 2024-03-18 DIAGNOSIS — H5203 Hypermetropia, bilateral: Secondary | ICD-10-CM | POA: Diagnosis not present

## 2024-03-18 DIAGNOSIS — Z961 Presence of intraocular lens: Secondary | ICD-10-CM | POA: Diagnosis not present

## 2024-04-22 ENCOUNTER — Other Ambulatory Visit: Payer: Self-pay | Admitting: Internal Medicine

## 2024-04-22 DIAGNOSIS — I10 Essential (primary) hypertension: Secondary | ICD-10-CM

## 2024-05-04 ENCOUNTER — Other Ambulatory Visit: Payer: Self-pay | Admitting: Cardiovascular Disease

## 2024-05-04 DIAGNOSIS — E785 Hyperlipidemia, unspecified: Secondary | ICD-10-CM

## 2024-06-09 ENCOUNTER — Ambulatory Visit: Attending: Physician Assistant | Admitting: Physician Assistant

## 2024-06-09 ENCOUNTER — Ambulatory Visit: Admitting: Nurse Practitioner

## 2024-06-09 ENCOUNTER — Encounter: Payer: Self-pay | Admitting: Physician Assistant

## 2024-06-09 VITALS — BP 132/84 | HR 84 | Ht 62.0 in | Wt 226.6 lb

## 2024-06-09 DIAGNOSIS — I2510 Atherosclerotic heart disease of native coronary artery without angina pectoris: Secondary | ICD-10-CM

## 2024-06-09 DIAGNOSIS — I10 Essential (primary) hypertension: Secondary | ICD-10-CM | POA: Diagnosis not present

## 2024-06-09 DIAGNOSIS — E785 Hyperlipidemia, unspecified: Secondary | ICD-10-CM

## 2024-06-09 MED ORDER — IRBESARTAN 150 MG PO TABS: 150.0000 mg | ORAL_TABLET | Freq: Every day | ORAL | 3 refills | Status: AC

## 2024-06-09 MED ORDER — ROSUVASTATIN CALCIUM 40 MG PO TABS: 40.0000 mg | ORAL_TABLET | Freq: Every day | ORAL | 3 refills | Status: AC

## 2024-06-09 NOTE — Patient Instructions (Signed)
 Medication Instructions:  Your physician recommends that you continue on your current medications as directed. Please refer to the Current Medication list given to you today.  *If you need a refill on your cardiac medications before your next appointment, please call your pharmacy*  Lab Work: None ordered If you have labs (blood work) drawn today and your tests are completely normal, you will receive your results only by: MyChart Message (if you have MyChart) OR A paper copy in the mail If you have any lab test that is abnormal or we need to change your treatment, we will call you to review the results.  Testing/Procedures: None ordered  Follow-Up: At [facility], you and your health needs are our priority.  As part of our continuing mission to provide you with exceptional heart care, our providers are all part of one team.  This team includes your primary Cardiologist (physician) and Advanced Practice Providers or APPs (Physician Assistants and Nurse Practitioners) who all work together to provide you with the care you need, when you need it.  Your next appointment:   1 year(s)  Provider:   Darryle ONEIDA Decent, MD    We recommend signing up for the patient portal called MyChart.  Sign up information is provided on this After Visit Summary.  MyChart is used to connect with patients for Virtual Visits (Telemedicine).  Patients are able to view lab/test results, encounter notes, upcoming appointments, etc.  Non-urgent messages can be sent to your provider as well.   To learn more about what you can do with MyChart, go to [email or web address].

## 2024-06-09 NOTE — Progress Notes (Signed)
 Cardiology Office Note   Date:  06/09/2024  ID:  CHAYNCE Anderson, DOB 11/21/1955, MRN [id number] PCP: Joshua Debby CROME, MD  [facility] Providers Cardiologist:  None     History of Present Illness Blake Anderson is a 68 y.o. male with PMH of morbid obesity, CAD, HTN and HLD.  Echocardiogram obtained on 03/26/2022 showed EF 55 to 60%, no regional wall motion abnormality, mild LVH, mild LAE.  Coronary CTA obtained on 04/02/2022 showed  50 to 69% moderate disease in proximal LAD, 25 to 49% disease in D1, 25 to 49% disease in proximal left circumflex artery, less than 25% disease in the RCA.  There was mid LAD myocardial bridging and a small PFO.  Coronary calcium  score of 1286 which placed the patient 93rd percentile for age and sex matched control.  Patient has been on aspirin and statin medication.  He was last seen by Dr. Barbaraann in August 2024 at which time he was doing well without any exertional symptom.  Patient presents today for follow-up.  He denies any chest pain or shortness of breath.  He does have dizzy spell if he stand up very quickly.  He has no lower extremity edema, orthopnea or PND.  Last lipid panel in March 2025 showed very well-controlled cholesterol.  He is scheduled to see his PCP next week.  He still orthostatic vital sign was not significant.  Overall, he is doing very well from the cardiac perspective and can follow-up in 1 year.  Will defer his annual blood work to his PCP  ROS:   He denies chest pain, palpitations, dyspnea, pnd, orthopnea, n, v, dizziness, syncope, edema, weight gain, or early satiety. All other systems reviewed and are otherwise negative except as noted above.    Studies Reviewed      Cardiac Studies & Procedures   ______________________________________________________________________________________________     ECHOCARDIOGRAM  ECHOCARDIOGRAM COMPLETE 03/26/2022  Narrative ECHOCARDIOGRAM REPORT    Patient Name:   LAVAL Anderson Date of Exam: 03/26/2022 Medical Rec #:  [id number]          Height:       63.0 in Accession #:    [id number]         Weight:       217.0 lb Date of Birth:  21-Dec-1955         BSA:          2.002 m Patient Age:    65 years           BP:           124/100 mmHg Patient Gender: M                  HR:           68 bpm. Exam Location:  [address]  Procedure: 2D Echo, Cardiac Doppler and Color Doppler  Indications:    I25.118 CAD  History:        Patient has no prior history of Echocardiogram examinations. CAD, Signs/Symptoms:Chest Pain; Risk Factors:Hypertension, Dyslipidemia and Obesity.  Sonographer:    Elsie Bohr RDCS Referring Phys: [id number] DARRYLE DEBBY O'NEAL  IMPRESSIONS   1. Left ventricular ejection fraction, by estimation, is 55 to 60%. The left ventricle has normal function. The left ventricle has no regional wall motion abnormalities. There is mild left ventricular hypertrophy. Left ventricular diastolic parameters were normal. 2. Right ventricular systolic function is normal. The right ventricular size is normal. 3. Left atrial size  was mildly dilated. 4. The mitral valve is normal in structure. No evidence of mitral valve regurgitation. No evidence of mitral stenosis. 5. The aortic valve was not well visualized. Aortic valve regurgitation is not visualized. No aortic stenosis is present. 6. The inferior vena cava is normal in size with greater than 50% respiratory variability, suggesting right atrial pressure of 3 mmHg.  FINDINGS Left Ventricle: Left ventricular ejection fraction, by estimation, is 55 to 60%. The left ventricle has normal function. The left ventricle has no regional wall motion abnormalities. The left ventricular internal cavity size was normal in size. There is mild left ventricular hypertrophy. Left ventricular diastolic parameters were normal.  Right Ventricle: The right ventricular size is normal. No increase in right ventricular wall  thickness. Right ventricular systolic function is normal.  Left Atrium: Left atrial size was mildly dilated.  Right Atrium: Right atrial size was normal in size.  Pericardium: There is no evidence of pericardial effusion.  Mitral Valve: The mitral valve is normal in structure. No evidence of mitral valve regurgitation. No evidence of mitral valve stenosis.  Tricuspid Valve: The tricuspid valve is normal in structure. Tricuspid valve regurgitation is not demonstrated. No evidence of tricuspid stenosis.  Aortic Valve: The aortic valve was not well visualized. Aortic valve regurgitation is not visualized. No aortic stenosis is present.  Pulmonic Valve: The pulmonic valve was normal in structure. Pulmonic valve regurgitation is not visualized. No evidence of pulmonic stenosis.  Aorta: The aortic root is normal in size and structure.  Venous: The inferior vena cava is normal in size with greater than 50% respiratory variability, suggesting right atrial pressure of 3 mmHg.  IAS/Shunts: No atrial level shunt detected by color flow Doppler.   LEFT VENTRICLE PLAX 2D LVIDd:         4.50 cm   Diastology LVIDs:         3.20 cm   LV e' medial:    7.83 cm/s LV PW:         1.20 cm   LV E/e' medial:  7.1 LV IVS:        1.20 cm   LV e' lateral:   7.07 cm/s LVOT diam:     2.10 cm   LV E/e' lateral: 7.9 LV SV:         58 LV SV Index:   29 LVOT Area:     3.46 cm   RIGHT VENTRICLE             IVC RV S prime:     15.70 cm/s  IVC diam: 1.00 cm TAPSE (M-mode): 2.0 cm  LEFT ATRIUM             Index        RIGHT ATRIUM           Index LA diam:        3.80 cm 1.90 cm/m   RA Pressure: 3.00 mmHg LA Vol (A2C):   52.8 ml 26.38 ml/m  RA Area:     16.90 cm LA Vol (A4C):   68.8 ml 34.37 ml/m  RA Volume:   45.10 ml  22.53 ml/m LA Biplane Vol: 65.3 ml 32.62 ml/m AORTIC VALVE LVOT Vmax:   84.60 cm/s LVOT Vmean:  53.600 cm/s LVOT VTI:    0.168 m  AORTA Ao Root diam: 3.60 cm Ao Asc diam:  3.10  cm  MITRAL VALVE               TRICUSPID  VALVE MV Area (PHT): 3.54 cm    Estimated RAP:  3.00 mmHg MV Decel Time: 214 msec MV E velocity: 55.50 cm/s  SHUNTS MV A velocity: 73.40 cm/s  Systemic VTI:  0.17 m MV E/A ratio:  0.76        Systemic Diam: 2.10 cm  Maude Emmer MD Electronically signed by Maude Emmer MD Signature Date/Time: 03/26/2022/3:39:14 PM    Final      CT SCANS  CT CORONARY FRACTIONAL FLOW RESERVE DATA PREP 04/02/2022  Narrative EXAM: CT FFR analysis was performed on the original cardiac CTA dataset. Diagrammatic representation of the CT FFR analysis is provided in a separate PDF document in PACS. This dictation was created using the PDF document and an interactive 3D model of the results. The 3D model is not available in the EMR/PACS.  INTERPRETATION: CT FFR provides simultaneous calculation of pressure and flow across the entire coronary tree. For clinical decision making, CT FFR values should be obtained 1-2 cm distal to the lower border of each stenosis measured. Coronary CTA-related artifacts may impair the diagnostic accuracy of the original cardiac CTA and FFR CT results. *Due to the fact that CT FFR represents a mathematically-derived analysis, it is recommended that the results be interpreted as follows:  1. CT FFR >0.80: Low likelihood of hemodynamic significance. 2. CT FFR 0.76-0.80: Borderline likelihood of hemodynamic significance. 3. CT FFR =< 0.75: High likelihood of hemodynamic significance.  *Coronary CT Angiography-derived Fractional Flow Reserve Testing in Patients with Stable Coronary Artery Disease: Recommendations on Interpretation and Reporting. Radiology: Cardiothoracic Imaging. 2019;1(5):e190050  FINDINGS: 1. Left Main: 0.99; low likelihood of hemodynamic significance. 2. Prox LAD: 0.91; low likelihood of hemodynamic significance.  3. D1: 0.86; low likelihood of hemodynamic significance. 4. LCX: 0.94; low likelihood of  hemodynamic significance. 5. RCA: 0.96; low likelihood of hemodynamic significance.  IMPRESSION:  1.  CT FFR analysis didn't show any significant stenosis.  Darryle Decent, MD   Electronically Signed By: Darryle Decent M.D. On: 04/02/2022 14:45   CT CORONARY MORPH W/CTA COR W/SCORE 04/02/2022  Addendum 04/02/2022  4:13 PM ADDENDUM REPORT: 04/02/2022 16:11  EXAM: OVER-READ INTERPRETATION  CT CHEST  The following report is a limited chest CT over-read performed by radiologist Dr. Elsie Ko [facility] on 04/02/2022. This over-read does not include interpretation of cardiac or coronary anatomy or pathology. The coronary CTA interpretation by the cardiologist is attached.  COMPARISON:  Cardiac CT 02/22/2022  FINDINGS: Mediastinum/Nodes: No enlarged lymph nodes within the visualized mediastinum.No significant extracardiac vascular findings.  Lungs/Pleura: There is no pleural effusion. The visualized lungs remain clear.  Upper abdomen: No significant findings in the visualized upper abdomen.  Musculoskeletal/Chest wall: No chest wall mass or suspicious osseous findings within the visualized chest.  IMPRESSION: No significant extracardiac findings within the visualized chest.   Electronically Signed By: Elsie Perone M.D. On: 04/02/2022 16:11  Narrative CLINICAL DATA:  Chest pain  EXAM: Cardiac/Coronary CTA  TECHNIQUE: A non-contrast, gated CT scan was obtained with axial slices of 3 mm through the heart for calcium  scoring. Calcium  scoring was performed using the Agatston method. A 120 kV prospective, gated, contrast cardiac scan was obtained. Gantry rotation speed was 250 msecs and collimation was 0.6 mm. Two sublingual nitroglycerin  tablets (0.8 mg) were given. The 3D data set was reconstructed in 5% intervals of the 35-75% of the R-R cycle. Diastolic phases were analyzed on a dedicated workstation using MPR, MIP, and VRT modes. The  patient received 95 cc  of contrast.  FINDINGS: Image quality: Excellent.  Noise artifact is: Limited.  Coronary Arteries:  Normal coronary origin.  Right dominance.  Left main: The left main is a large caliber vessel with a normal take off from the left coronary cusp that trifurcates into a LAD, LCX, and ramus intermedius. There is minimal calcified plaque (<25%).  Left anterior descending artery: The proximal LAD contains moderate mixed density plaque (50-69%). The mid and distal segments are patent. A mid LAD myocardial bridge is present (normal variant). The first diagonal contains mild mixed density plaque (25-49%). The second diagonal is patent.  Ramus intermedius: Too small for analysis.  Left circumflex artery: The LCX is non-dominant. The proximal LCX contains mild calcified plaque (25-49%). The LCX gives off 1 patent OM branch.  Right coronary artery: The RCA is dominant with normal take off from the right coronary cusp. There is minimal diffuse mixed density plaque (<25%). The RCA terminates as a PDA and right posterolateral branch without evidence of plaque or stenosis.  Right Atrium: Right atrial size is within normal limits.  Right Ventricle: The right ventricular cavity is within normal limits.  Left Atrium: Left atrial size is normal in size with no left atrial appendage filling defect. Small PFO.  Left Ventricle: The ventricular cavity size is within normal limits.  Pulmonary arteries: Normal in size without proximal filling defect.  Pulmonary veins: Normal pulmonary venous drainage.  Pericardium: Normal thickness without significant effusion or calcium  present. Prominent pericardial fat.  Cardiac valves: The aortic valve is trileaflet without significant calcification. The mitral valve is normal without significant calcification.  Aorta: Normal caliber without significant disease.  Extra-cardiac findings: See attached radiology report  for non-cardiac structures.  IMPRESSION: 1. Coronary calcium  score of 1286. This was 93rd percentile for age-, sex, and race-matched controls.  2. Normal coronary origin with right dominance.  3. Moderate mixed density plaque (50-69%) in the proximal LAD.  4. Mild calcified plaque (25-49%) in the LCX.  5. Minimal mixed density plaque (<25%) in the RCA.  6. Mid LAD myocardial bridge (normal).  7. Small PFO.  RECOMMENDATIONS: 1. Moderate stenosis. Consider symptom-guided anti-ischemic pharmacotherapy as well as risk factor modification per guideline directed care. Additional analysis with CT FFR will be submitted.  Darryle Decent, MD  Electronically Signed: By: Darryle Decent M.D. On: 04/02/2022 14:38   CT SCANS  CT CARDIAC SCORING (SELF PAY ONLY) 02/22/2022  Addendum 02/22/2022  4:44 PM ADDENDUM REPORT: 02/22/2022 16:41  CLINICAL DATA:  Cardiovascular Disease Risk stratification  EXAM: Coronary Calcium  Score  TECHNIQUE: A gated, non-contrast computed tomography scan of the heart was performed using 3mm slice thickness. Axial images were analyzed on a dedicated workstation. Calcium  scoring of the coronary arteries was performed using the Agatston method.  FINDINGS: Coronary arteries: Normal origins.  Coronary Calcium  Score:  Left main: 73.7  Left anterior descending artery: 631  Left circumflex artery: 169  Right coronary artery: 259  Total: 1133  Percentile: 92nd  Pericardium: Normal.  Ascending Aorta: Normal caliber.  Non-cardiac: See separate report from [facility].  IMPRESSION: Coronary calcium  score of 1133 Agatston units. This was 92nd percentile for age-, race-, and sex-matched controls.  RECOMMENDATIONS: Coronary artery calcium  (CAC) score is a strong predictor of incident coronary heart disease (CHD) and provides predictive information beyond traditional risk factors. CAC scoring is reasonable to use in the decision to  withhold, postpone, or initiate statin therapy in intermediate-risk or selected borderline-risk asymptomatic adults (age 8-75 years and LDL-C >=70 to <  190 mg/dL) who do not have diabetes or established atherosclerotic cardiovascular disease (ASCVD).* In intermediate-risk (10-year ASCVD risk >=7.5% to <20%) adults or selected borderline-risk (10-year ASCVD risk >=5% to <7.5%) adults in whom a CAC score is measured for the purpose of making a treatment decision the following recommendations have been made:  If CAC=0, it is reasonable to withhold statin therapy and reassess in 5 to 10 years, as long as higher risk conditions are absent (diabetes mellitus, family history of premature CHD in first degree relatives (males <55 years; females <65 years), cigarette smoking, or LDL >=190 mg/dL).  If CAC is 1 to 99, it is reasonable to initiate statin therapy for patients >=63 years of age.  If CAC is >=100 or >=75th percentile, it is reasonable to initiate statin therapy at any age.  Cardiology referral should be considered for patients with CAC scores >=400 or >=75th percentile.  *2018 AHA/ACC/AACVPR/AAPA/ABC/ACPM/ADA/AGS/APhA/ASPC/NLA/PCNA Guideline on the Management of Blood Cholesterol: A Report of the American College of Cardiology/American Heart Association Task Force on Clinical Practice Guidelines. J Am Coll Cardiol. 2019;73(24):3168-3209.  Ezra Shuck, MD   Electronically Signed By: Ezra Shuck M.D. On: 02/22/2022 16:41  Narrative EXAM: OVER-READ INTERPRETATION  CT CHEST  The following report is a limited chest CT over-read performed by radiologist Dr. Franky Crease of [facility] on 02/22/2022. This over-read does not include interpretation of cardiac or coronary anatomy or pathology. The coronary calcium  score interpretation by the cardiologist is attached.  COMPARISON:  None Available.  FINDINGS: Vascular: Heart is normal size. Aorta normal  caliber. Scattered aortic calcifications.  Mediastinum/Nodes: No adenopathy.  Lungs/Pleura: No confluent opacities or effusions.  Upper Abdomen: No acute findings  Musculoskeletal: Chest wall soft tissues are unremarkable. No acute bony abnormality.  IMPRESSION: Scattered aortic atherosclerosis.  No acute extra cardiac abnormality.  Electronically Signed: By: Franky Crease M.D. On: 02/22/2022 16:12     ______________________________________________________________________________________________      Risk Assessment/Calculations           Physical Exam VS:  BP 132/84   Pulse 84   Ht 5' 2 (1.575 m)   Wt 226 lb 9.6 oz (102.8 kg)   SpO2 97%   BMI 41.45 kg/m        Wt Readings from Last 3 Encounters:  06/09/24 226 lb 9.6 oz (102.8 kg)  10/29/23 218 lb 12.8 oz (99.2 kg)  10/15/23 218 lb (98.9 kg)    GEN: Well nourished, well developed in no acute distress NECK: No JVD; No carotid bruits CARDIAC: RRR, no murmurs, rubs, gallops RESPIRATORY:  Clear to auscultation without rales, wheezing or rhonchi  ABDOMEN: Soft, non-tender, non-distended EXTREMITIES:  No edema; No deformity   ASSESSMENT AND PLAN  CAD: Previous coronary CTA showed nonobstructive disease.  He denies any exertional chest pain or shortness of breath.  He has no claudication symptoms  Hypertension: Blood pressure stable on irbesartan   Hyperlipidemia: On rosuvastatin  40 mg daily.  Previous lipid panel obtained in March showed very well-controlled cholesterol.  Will defer annual blood work to PCP.       Dispo: Follow-up in 1 year  Signed, Akif Weldy, [place]

## 2024-10-15 ENCOUNTER — Ambulatory Visit
